# Patient Record
Sex: Female | Born: 1995 | Race: Black or African American | Hispanic: No | Marital: Married | Smoking: Never smoker
Health system: Southern US, Community
[De-identification: ages and names within clinical notes are randomized; demographics above are authoritative.]

## PROBLEM LIST (undated history)

## (undated) ENCOUNTER — Inpatient Hospital Stay (HOSPITAL_COMMUNITY): Payer: Self-pay

## (undated) DIAGNOSIS — F410 Panic disorder [episodic paroxysmal anxiety] without agoraphobia: Secondary | ICD-10-CM

## (undated) DIAGNOSIS — R011 Cardiac murmur, unspecified: Secondary | ICD-10-CM

## (undated) DIAGNOSIS — D649 Anemia, unspecified: Secondary | ICD-10-CM

## (undated) HISTORY — PX: NO PAST SURGERIES: SHX2092

---
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
## 2014-02-28 ENCOUNTER — Emergency Department (HOSPITAL_COMMUNITY)
Admission: EM | Admit: 2014-02-28 | Discharge: 2014-03-01 | Discharge status: Against Medical Advice | Payer: Medicaid Other | Attending: Emergency Medicine | Admitting: Emergency Medicine

## 2014-02-28 ENCOUNTER — Encounter (HOSPITAL_COMMUNITY): Payer: Self-pay | Admitting: Emergency Medicine

## 2014-02-28 DIAGNOSIS — Y998 Other external cause status: Secondary | ICD-10-CM | POA: Diagnosis not present

## 2014-02-28 DIAGNOSIS — Y9289 Other specified places as the place of occurrence of the external cause: Secondary | ICD-10-CM | POA: Insufficient documentation

## 2014-02-28 DIAGNOSIS — N898 Other specified noninflammatory disorders of vagina: Secondary | ICD-10-CM | POA: Diagnosis not present

## 2014-02-28 DIAGNOSIS — W2203XA Walked into furniture, initial encounter: Secondary | ICD-10-CM | POA: Diagnosis not present

## 2014-02-28 DIAGNOSIS — S99921A Unspecified injury of right foot, initial encounter: Secondary | ICD-10-CM | POA: Insufficient documentation

## 2014-02-28 DIAGNOSIS — Y9389 Activity, other specified: Secondary | ICD-10-CM | POA: Insufficient documentation

## 2014-02-28 NOTE — ED Notes (Signed)
Pt states she hit her right pinky toe on the dresser a week ago. Pt states toe has been hurting since incident rates pain 5/10. No visible swelling to right pinky toe. Pt states she normally uses feminine wash but started to use Dial and believes it may be causing change and odor with vaginal discharge. Pt reports change in vaginal discharge has been occurring for a couple of weeks.

## 2014-02-28 NOTE — ED Notes (Signed)
Pt called x2 for triage, no answer.  

## 2014-02-28 NOTE — ED Notes (Signed)
Pt called once for room assignment. No response.

## 2014-03-01 NOTE — ED Provider Notes (Signed)
Patient LWBS after triage.  I did not see or evaluate patient.  Garlon HatchetLisa M Teja Judice, PA-C 03/01/14 [phone]  Shon Batonourtney F Horton, MD 03/01/14 [phone]

## 2014-03-29 ENCOUNTER — Inpatient Hospital Stay (HOSPITAL_COMMUNITY)
Admission: AD | Admit: 2014-03-29 | Discharge: 2014-03-29 | Disposition: A | Discharge status: Home / Self Care | Payer: Medicaid Other | Source: Ambulatory Visit | Attending: Family Medicine | Admitting: Family Medicine

## 2014-03-29 ENCOUNTER — Encounter (HOSPITAL_COMMUNITY): Payer: Self-pay | Admitting: *Deleted

## 2014-03-29 DIAGNOSIS — R5383 Other fatigue: Secondary | ICD-10-CM | POA: Diagnosis not present

## 2014-03-29 DIAGNOSIS — O210 Mild hyperemesis gravidarum: Secondary | ICD-10-CM | POA: Insufficient documentation

## 2014-03-29 DIAGNOSIS — Z3A01 Less than 8 weeks gestation of pregnancy: Secondary | ICD-10-CM | POA: Insufficient documentation

## 2014-03-29 DIAGNOSIS — O219 Vomiting of pregnancy, unspecified: Secondary | ICD-10-CM

## 2014-03-29 HISTORY — DX: Cardiac murmur, unspecified: R01.1

## 2014-03-29 LAB — URINALYSIS, ROUTINE W REFLEX MICROSCOPIC
Bilirubin Urine: NEGATIVE
GLUCOSE, UA: NEGATIVE mg/dL
HGB URINE DIPSTICK: NEGATIVE
Ketones, ur: NEGATIVE mg/dL
Leukocytes, UA: NEGATIVE
NITRITE: NEGATIVE
PROTEIN: NEGATIVE mg/dL
Specific Gravity, Urine: 1.02 (ref 1.005–1.030)
UROBILINOGEN UA: 0.2 mg/dL (ref 0.0–1.0)
pH: 7 (ref 5.0–8.0)

## 2014-03-29 LAB — POCT PREGNANCY, URINE: PREG TEST UR: POSITIVE — AB

## 2014-03-29 MED ORDER — METOCLOPRAMIDE HCL 10 MG PO TABS: 10.0000 mg | ORAL_TABLET | Freq: Three times a day (TID) | ORAL | Status: DC

## 2014-03-29 MED ORDER — METOCLOPRAMIDE HCL 10 MG PO TABS
10.0000 mg | ORAL_TABLET | Freq: Once | ORAL | Status: AC
  Administered 2014-03-29: 10 mg via ORAL
  Filled 2014-03-29: qty 1

## 2014-03-29 MED ORDER — PROMETHAZINE HCL 25 MG PO TABS: 25.0000 mg | ORAL_TABLET | Freq: Four times a day (QID) | ORAL | Status: DC | PRN

## 2014-03-29 NOTE — MAU Provider Note (Signed)
History     CSN: [id number]  Arrival date and time: 03/29/14 1525   First Provider Initiated Contact with Patient 03/29/14 1625      Chief Complaint  Patient presents with  . +HPT   . Fatigue   HPI  [name] is 18 y.o. female G2P0 at 5561w6d who presents with fatigue, N/V. She is also needing a pregnancy verification letter. She has not started prenatal care. She is here with N/V and weakness X 2-3 days. She has vomited twice today. She has not been able to eat or drink much.     OB History    Gravida Para Term Preterm AB TAB SAB Ectopic Multiple Living   2         0      Past Medical History  Diagnosis Date  . Heart murmur     Past Surgical History  Procedure Laterality Date  . No past surgeries      History reviewed. No pertinent family history.  History  Substance Use Topics  . Smoking status: Never Smoker   . Smokeless tobacco: Never Used  . Alcohol Use: No    Allergies: No Known Allergies  Prescriptions prior to admission  Medication Sig Dispense Refill Last Dose  . hydrocortisone cream 1 % Apply 1 application topically daily.   03/29/2014 at Unknown time  . Probiotic Product (PROBIOTIC ACIDOPHILUS) CAPS Take 1 tablet by mouth daily.   Past Week at Unknown time   Results for orders placed or performed during the hospital encounter of 03/29/14 (from the past 48 hour(s))  Urinalysis, Routine w reflex microscopic     Status: None   Collection Time: 03/29/14  3:38 PM  Result Value Ref Range   Color, Urine YELLOW YELLOW   APPearance CLEAR CLEAR   Specific Gravity, Urine 1.020 1.005 - 1.030   pH 7.0 5.0 - 8.0   Glucose, UA NEGATIVE NEGATIVE mg/dL   Hgb urine dipstick NEGATIVE NEGATIVE   Bilirubin Urine NEGATIVE NEGATIVE   Ketones, ur NEGATIVE NEGATIVE mg/dL   Protein, ur NEGATIVE NEGATIVE mg/dL   Urobilinogen, UA 0.2 0.0 - 1.0 mg/dL   Nitrite NEGATIVE NEGATIVE   Leukocytes, UA NEGATIVE NEGATIVE    Comment: MICROSCOPIC NOT DONE ON URINES WITH  NEGATIVE PROTEIN, BLOOD, LEUKOCYTES, NITRITE, OR GLUCOSE <1000 mg/dL.  Pregnancy, urine POC     Status: Abnormal   Collection Time: 03/29/14  3:48 PM  Result Value Ref Range   Preg Test, Ur POSITIVE (A) NEGATIVE    Comment:        THE SENSITIVITY OF THIS METHODOLOGY IS >24 mIU/mL     Review of Systems  Constitutional: Negative for fever and chills.  Gastrointestinal: Positive for nausea and vomiting. Negative for heartburn, abdominal pain (Denies pain currently. Occasional lower abdominal cramping. Rates her pain 0/10. ), diarrhea and constipation.  Genitourinary: Negative for dysuria, urgency, frequency and hematuria.       Denies vaginal bleeding  Denies vaginal discharge    Physical Exam   Blood pressure 120/65, pulse 73, temperature 98.2 F (36.8 C), temperature source Oral, resp. rate 18, height 5' 7.5" (1.715 m), weight 56.87 kg (125 lb 6 oz), last menstrual period 02/16/2014.  Physical Exam  Constitutional: She is oriented to person, place, and time. She appears well-developed and well-nourished. No distress.  HENT:  Head: Normocephalic.  Eyes: Pupils are equal, round, and reactive to light.  Neck: Neck supple.  Cardiovascular: Normal rate and normal heart sounds.   Respiratory:  Effort normal and breath sounds normal.  GI: Soft. She exhibits no distension and no mass. There is no tenderness. There is no rebound and no guarding.  Musculoskeletal: Normal range of motion.  Neurological: She is alert and oriented to person, place, and time.  Skin: Skin is warm. She is not diaphoretic.    MAU Course  Procedures  None  MDM Reglan 10 mg UA shows minimal dehydration   Assessment and Plan   A: 1. Nausea and vomiting during pregnancy    P: Discharge home in stable condition RX: Reglan, phenergan Return to MAU if symptoms worsen First trimester warning signs discussed   Iona HansenJennifer Irene Reyaansh Merlo, NP 03/29/2014 5:15 PM

## 2014-03-29 NOTE — Discharge Instructions (Signed)

## 2014-03-29 NOTE — MAU Note (Signed)
Patient presents with complaints of nausea, fatigue, no appetite and possible fever as well as a +HPT.

## 2014-04-15 ENCOUNTER — Inpatient Hospital Stay (HOSPITAL_COMMUNITY)
Admission: AD | Admit: 2014-04-15 | Discharge: 2014-04-15 | Disposition: A | Discharge status: Home / Self Care | Payer: Medicaid Other | Source: Ambulatory Visit | Attending: Obstetrics & Gynecology | Admitting: Obstetrics & Gynecology

## 2014-04-15 ENCOUNTER — Encounter (HOSPITAL_COMMUNITY): Payer: Self-pay | Admitting: *Deleted

## 2014-04-15 DIAGNOSIS — Z3A08 8 weeks gestation of pregnancy: Secondary | ICD-10-CM | POA: Insufficient documentation

## 2014-04-15 DIAGNOSIS — O210 Mild hyperemesis gravidarum: Secondary | ICD-10-CM | POA: Insufficient documentation

## 2014-04-15 DIAGNOSIS — O219 Vomiting of pregnancy, unspecified: Secondary | ICD-10-CM

## 2014-04-15 DIAGNOSIS — E7439 Other disorders of intestinal carbohydrate absorption: Secondary | ICD-10-CM | POA: Diagnosis not present

## 2014-04-15 DIAGNOSIS — R7302 Impaired glucose tolerance (oral): Secondary | ICD-10-CM

## 2014-04-15 LAB — URINE MICROSCOPIC-ADD ON

## 2014-04-15 LAB — URINALYSIS, ROUTINE W REFLEX MICROSCOPIC
BILIRUBIN URINE: NEGATIVE
GLUCOSE, UA: 250 mg/dL — AB
HGB URINE DIPSTICK: NEGATIVE
Ketones, ur: 15 mg/dL — AB
LEUKOCYTES UA: NEGATIVE
Nitrite: NEGATIVE
PROTEIN: 100 mg/dL — AB
Specific Gravity, Urine: >1.03 — ABNORMAL HIGH (ref 1.005–1.030)
UROBILINOGEN UA: 2 mg/dL — AB (ref 0.0–1.0)
pH: 6 (ref 5.0–8.0)

## 2014-04-15 LAB — GLUCOSE, CAPILLARY: GLUCOSE-CAPILLARY: 133 mg/dL — AB (ref 70–99)

## 2014-04-15 MED ORDER — PROMETHAZINE HCL 25 MG PO TABS
12.5000 mg | ORAL_TABLET | Freq: Once | ORAL | Status: AC
  Administered 2014-04-15: 12.5 mg via ORAL
  Filled 2014-04-15: qty 1

## 2014-04-15 MED ORDER — PROMETHAZINE HCL 12.5 MG PO TABS: 12.5000 mg | ORAL_TABLET | Freq: Four times a day (QID) | ORAL | Status: DC | PRN

## 2014-04-15 NOTE — Discharge Instructions (Signed)
Morning Sickness °Morning sickness is when you feel sick to your stomach (nauseous) during pregnancy. This nauseous feeling may or may not come with vomiting. It often occurs in the morning but can be a problem any time of day. Morning sickness is most common during the first trimester, but it may continue throughout pregnancy. While morning sickness is unpleasant, it is usually harmless unless you develop severe and continual vomiting (hyperemesis gravidarum). This condition requires more intense treatment.  °CAUSES  °The cause of morning sickness is not completely known but seems to be related to normal hormonal changes that occur in pregnancy. °RISK FACTORS °You are at greater risk if you: °· Experienced nausea or vomiting before your pregnancy. °· Had morning sickness during a previous pregnancy. °· Are pregnant with more than one baby, such as twins. °TREATMENT  °Do not use any medicines (prescription, over-the-counter, or herbal) for morning sickness without first talking to your health care provider. Your health care provider may prescribe or recommend: °· Vitamin B6 supplements. °· Anti-nausea medicines. °· The herbal medicine ginger. °HOME CARE INSTRUCTIONS  °· Only take over-the-counter or prescription medicines as directed by your health care provider. °· Taking multivitamins before getting pregnant can prevent or decrease the severity of morning sickness in most women. °· Eat a piece of dry toast or unsalted crackers before getting out of bed in the morning. °· Eat five or six small meals a day. °· Eat dry and bland foods (rice, baked potato). Foods high in carbohydrates are often helpful. °· Do not drink liquids with your meals. Drink liquids between meals. °· Avoid greasy, fatty, and spicy foods. °· Get someone to cook for you if the smell of any food causes nausea and vomiting. °· If you feel nauseous after taking prenatal vitamins, take the vitamins at night or with a snack.  °· Snack on protein  foods (nuts, yogurt, cheese) between meals if you are hungry. °· Eat unsweetened gelatins for desserts. °· Wearing an acupressure wristband (worn for sea sickness) may be helpful. °· Acupuncture may be helpful. °· Do not smoke. °· Get a humidifier to keep the air in your house free of odors. °· Get plenty of fresh air. °SEEK MEDICAL CARE IF:  °· Your home remedies are not working, and you need medicine. °· You feel dizzy or lightheaded. °· You are losing weight. °SEEK IMMEDIATE MEDICAL CARE IF:  °· You have persistent and uncontrolled nausea and vomiting. °· You pass out (faint). °MAKE SURE YOU: °· Understand these instructions. °· Will watch your condition. °· Will get help right away if you are not doing well or get worse. °Document Released: 05/26/2006 Document Revised: 04/09/2013 Document Reviewed: 09/19/2012 °ExitCare® Patient Information ©2015 [employer]. This information is not intended to replace advice given to you by your health care provider. Make sure you discuss any questions you have with your health care provider. °Prenatal Care Providers °[facility]    [facility] ° Phone- [phone]     Phone: [phone] °         °[facility]                      [facility] ° @[place]     Phone: [phone] ° Phone: [phone] °        [facility] °[facility]     Phone: [phone] ° Phone: [phone]   °          [facility] °[facility] [place]                hone: [phone] ° Phone: [phone] °        [facility] °Dr. Bernard Marshall      Phone: [phone] ° Phone: [phone] °        [facility] °[facility]                Phone: [phone] ° [facility]  ° Phone:[phone]    [facility] °         Phone: [phone] °[facility] °  Phone: [phone] ° °

## 2014-04-15 NOTE — MAU Note (Signed)
Pt states she  Has been throwing up constantly about 5 days ago. Pt states she can't keep anything down not even water

## 2014-04-15 NOTE — MAU Provider Note (Signed)
Chief Complaint: Hematemesis and Abdominal Cramping   First Provider Initiated Contact with Patient 04/15/14 19 1644     SUBJECTIVE HPI: Angela Acosta States is a 18 y.o. G1P0 at 2258w2d by LMP who presents with persistent nausea and vomiting. Had blood streaked emesis today. Has not retained much food or fluids for 4 days, vomiting most times after she eats. Has not eaten today. Ran out of Phenergan 5 d ago. It had been effective. Mid abdomen is sore from vomiting. Denies pelvic pain. No vaginal bleeding. No prenatal care yet. Has [facility].   Past Medical History  Diagnosis Date  . Heart murmur    OB History  Gravida Para Term Preterm AB SAB TAB Ectopic Multiple Living  1         0    # Outcome Date GA Lbr Len/2nd Weight Sex Delivery Anes PTL Lv  1 Current              Past Surgical History  Procedure Laterality Date  . No past surgeries     History   Social History  . Marital Status: Single    Spouse Name: N/A    Number of Children: N/A  . Years of Education: N/A   Occupational History  . Not on file.   Social History Main Topics  . Smoking status: Never Smoker   . Smokeless tobacco: Never Used  . Alcohol Use: No  . Drug Use: No  . Sexual Activity: Yes    Birth Control/ Protection: Condom   Other Topics Concern  . Not on file   Social History Narrative   No current facility-administered medications on file prior to encounter.   Current Outpatient Prescriptions on File Prior to Encounter  Medication Sig Dispense Refill  . metoCLOPramide (REGLAN) 10 MG tablet Take 1 tablet (10 mg total) by mouth 3 (three) times daily before meals. 30 tablet 0  . promethazine (PHENERGAN) 25 MG tablet Take 1 tablet (25 mg total) by mouth every 6 (six) hours as needed for nausea or vomiting. 30 tablet 0   Not on File  ROS: Pertinent items in HPI  OBJECTIVE Blood pressure 127/80, pulse 92, temperature 98.8 F (37.1 C), resp. rate 16, height 5' 7.5" (1.715 m), weight 51.529 kg (113 lb 9.6 oz),  last menstrual period 02/16/2014, SpO2 100 %. GENERAL: Thin  female in no acute distress. Tearful worried about fetus due to her not eating. HEENT: Normocephalic HEART: normal rate RESP: normal effort ABDOMEN: Soft, non-tender EXTREMITIES: Nontender, no edema NEURO: Alert and oriented  LAB RESULTS Results for orders placed or performed during the hospital encounter of 04/15/14 (from the past 24 hour(s))  Urinalysis, Routine w reflex microscopic     Status: Abnormal   Collection Time: 04/15/14  4:24 PM  Result Value Ref Range   Color, Urine AMBER (A) YELLOW   APPearance CLEAR CLEAR   Specific Gravity, Urine >1.030 (H) 1.005 - 1.030   pH 6.0 5.0 - 8.0   Glucose, UA 250 (A) NEGATIVE mg/dL   Hgb urine dipstick NEGATIVE NEGATIVE   Bilirubin Urine NEGATIVE NEGATIVE   Ketones, ur 15 (A) NEGATIVE mg/dL   Protein, ur 161100 (A) NEGATIVE mg/dL   Urobilinogen, UA 2.0 (H) 0.0 - 1.0 mg/dL   Nitrite NEGATIVE NEGATIVE   Leukocytes, UA NEGATIVE NEGATIVE  Urine microscopic-add on     Status: Abnormal   Collection Time: 04/15/14  4:24 PM  Result Value Ref Range   Squamous Epithelial / LPF FEW (A) RARE  WBC, UA 3-6 <3 WBC/hpf   Bacteria, UA MANY (A) RARE   Urine-Other MUCOUS PRESENT   Glucose, capillary     Status: Abnormal   Collection Time: 04/15/14  5:24 PM  Result Value Ref Range   Glucose-Capillary 133 (H) 70 - 99 mg/dL    IMAGING Bedside US by me: Normal fetal heart activity viewed by pt  MAU COURSE No vomiting while here. Phenergan 12.5 mg po given  ASSESSMENT 1. Nausea and vomiting during pregnancy prior to [redacted] weeks gestation   2. Glucose intolerance (impaired glucose tolerance)     PLAN Discharge home with reassurance AVS on morning sickness.  Needs further evaluation for possible impaired glucose tolerance    Medication List    TAKE these medications        metoCLOPramide 10 MG tablet  Commonly known as:  REGLAN  Take 1 tablet (10 mg total) by mouth 3 (three) times  daily before meals.     promethazine 12.5 MG tablet  Commonly known as:  PHENERGAN  Take 1 tablet (12.5 mg total) by mouth every 6 (six) hours as needed for nausea or vomiting.       Follow-up Information    [facility] [place] In 1 week.   Why:   Choose pregnancy care provider from the list given   Contact information:   [address] [facility] [address] [place] [id number] [phone]      Danae OrleansDeirdre C Kinlie Janice, CNM 04/15/2014  4:45 PM

## 2014-04-15 NOTE — Progress Notes (Signed)
Pt states she thinks her abdomen is sore from throwing up so much

## 2014-04-15 NOTE — MAU Note (Signed)
Patient states she has had vomiting through the entire pregnancy, was given Phenergan and Reglan but ran out of Phenergan about 2 weeks ago. Has not been able to keep anything down for at least 4 days. Having abdominal cramping. Denies bleeding or discharge, no diarrhea.

## 2014-04-18 NOTE — L&D Delivery Note (Signed)
Patient was C/C/+2 and pushed for 45 minutes with epidural.    Pt was uncomfortable with pushing and getting anxious- offered episiotomy to effect delivery and she accepted. VE placed outlet to help with delivery as well- pulled only once.  VEVD  female infant, Apgars 9,9, weight P.   The patient had a third degree extention of second degree midline episiotomy repaired with 2-0 vicryl R in usual fashion. Fundus was firm. EBL was expected amount- 200 cc by my estimate.. Placenta was delivered intact. Vagina was clear.  Baby was vigorous and doing skin to skin with mother.  Isidro Monks A

## 2014-05-05 ENCOUNTER — Encounter (HOSPITAL_COMMUNITY): Payer: Self-pay | Admitting: *Deleted

## 2014-05-05 ENCOUNTER — Inpatient Hospital Stay (HOSPITAL_COMMUNITY)
Admission: AD | Admit: 2014-05-05 | Discharge: 2014-05-05 | Disposition: A | Discharge status: Home / Self Care | Payer: Medicaid Other | Source: Ambulatory Visit | Attending: Family Medicine | Admitting: Family Medicine

## 2014-05-05 DIAGNOSIS — Z3A11 11 weeks gestation of pregnancy: Secondary | ICD-10-CM | POA: Diagnosis not present

## 2014-05-05 DIAGNOSIS — O23591 Infection of other part of genital tract in pregnancy, first trimester: Secondary | ICD-10-CM | POA: Insufficient documentation

## 2014-05-05 DIAGNOSIS — N760 Acute vaginitis: Secondary | ICD-10-CM | POA: Insufficient documentation

## 2014-05-05 DIAGNOSIS — R1030 Lower abdominal pain, unspecified: Secondary | ICD-10-CM

## 2014-05-05 DIAGNOSIS — Z3491 Encounter for supervision of normal pregnancy, unspecified, first trimester: Secondary | ICD-10-CM

## 2014-05-05 DIAGNOSIS — B9689 Other specified bacterial agents as the cause of diseases classified elsewhere: Secondary | ICD-10-CM | POA: Insufficient documentation

## 2014-05-05 DIAGNOSIS — O9989 Other specified diseases and conditions complicating pregnancy, childbirth and the puerperium: Secondary | ICD-10-CM | POA: Insufficient documentation

## 2014-05-05 DIAGNOSIS — D649 Anemia, unspecified: Secondary | ICD-10-CM

## 2014-05-05 LAB — COMPREHENSIVE METABOLIC PANEL
ALBUMIN: 3.5 g/dL (ref 3.5–5.2)
ALK PHOS: 45 U/L (ref 39–117)
ALT: 15 U/L (ref 0–35)
ANION GAP: 5 (ref 5–15)
AST: 19 U/L (ref 0–37)
BUN: 7 mg/dL (ref 6–23)
CALCIUM: 9.1 mg/dL (ref 8.4–10.5)
CO2: 25 mmol/L (ref 19–32)
CREATININE: 0.33 mg/dL — AB (ref 0.50–1.10)
Chloride: 105 mEq/L (ref 96–112)
GFR calc non Af Amer: >90 mL/min (ref >90)
GLUCOSE: 105 mg/dL — AB (ref 70–99)
Potassium: 4.1 mmol/L (ref 3.5–5.1)
Sodium: 135 mmol/L (ref 135–145)
TOTAL PROTEIN: 6.9 g/dL (ref 6.0–8.3)
Total Bilirubin: 0.3 mg/dL (ref 0.3–1.2)

## 2014-05-05 LAB — URINALYSIS, ROUTINE W REFLEX MICROSCOPIC
Bilirubin Urine: NEGATIVE
Glucose, UA: NEGATIVE mg/dL
Hgb urine dipstick: NEGATIVE
KETONES UR: 15 mg/dL — AB
LEUKOCYTES UA: NEGATIVE
NITRITE: NEGATIVE
PH: 6.5 (ref 5.0–8.0)
PROTEIN: NEGATIVE mg/dL
Urobilinogen, UA: 0.2 mg/dL (ref 0.0–1.0)

## 2014-05-05 LAB — CBC WITH DIFFERENTIAL/PLATELET
BASOS ABS: 0 10*3/uL (ref 0.0–0.1)
BASOS PCT: 0 % (ref 0–1)
Eosinophils Absolute: 0 10*3/uL (ref 0.0–0.7)
Eosinophils Relative: 1 % (ref 0–5)
HCT: 31.6 % — ABNORMAL LOW (ref 36.0–46.0)
HEMOGLOBIN: 10.8 g/dL — AB (ref 12.0–15.0)
LYMPHS ABS: 2.2 10*3/uL (ref 0.7–4.0)
LYMPHS PCT: 26 % (ref 12–46)
MCH: 24.5 pg — ABNORMAL LOW (ref 26.0–34.0)
MCHC: 34.2 g/dL (ref 30.0–36.0)
MCV: 71.8 fL — AB (ref 78.0–100.0)
MONOS PCT: 7 % (ref 3–12)
Monocytes Absolute: 0.6 10*3/uL (ref 0.1–1.0)
NEUTROS ABS: 5.8 10*3/uL (ref 1.7–7.7)
Neutrophils Relative %: 67 % (ref 43–77)
Platelets: 256 10*3/uL (ref 150–400)
RBC: 4.4 MIL/uL (ref 3.87–5.11)
RDW: 14.6 % (ref 11.5–15.5)
WBC: 8.6 10*3/uL (ref 4.0–10.5)

## 2014-05-05 LAB — WET PREP, GENITAL
Trich, Wet Prep: NONE SEEN
Yeast Wet Prep HPF POC: NONE SEEN

## 2014-05-05 MED ORDER — METRONIDAZOLE 500 MG PO TABS: 500.0000 mg | ORAL_TABLET | Freq: Two times a day (BID) | ORAL | Status: DC

## 2014-05-05 NOTE — MAU Provider Note (Signed)
History     CSN: [id number]  Arrival date and time: 05/05/14 1240   None     Chief Complaint  Patient presents with  . Abdominal Pain   Abdominal Pain Associated symptoms include nausea. Pertinent negatives include no constipation, diarrhea, dysuria, fever, headaches, hematuria or vomiting.    JM is a 19 y.o. G1P0 8370w1d presenting with abdominal cramps in suprapubic region. Started experiencing abdominal cramps about 3 days ago. The cramps are a 5/10 on the pain scale and are intermittent. Nothing reduces the discomfort. Cramps occur more often when stomach is empty.  Denies contractions, vaginal pain, vaginal bleeding, vaginal discharge, vomiting, blood in stool, diarrhea and diarrhea. Experiencing some nausea. Last BM was 1-2 days ago, however has difficulty passing stool.   Of note, pt states "bladder is sore" after voiding x 1 week. Has difficulty starting stream and hurts when bladder is full. Feels better sitting on the toilet after done voiding. Denies dysuria, hematuria, painful urination.   Pt is currently sexually active, last sexual intercourse was 2 weeks ago x 1 partner - no protection. Denies h/o STDs. Partner had chlamydia a couple years ago.   [facility], [facility]   OB History    Gravida Para Term Preterm AB TAB SAB Ectopic Multiple Living   1         0      Past Medical History  Diagnosis Date  . Heart murmur     Past Surgical History  Procedure Laterality Date  . No past surgeries      Family History  Problem Relation Age of Onset  . Hypertension Father   . Cancer Paternal Grandmother     History  Substance Use Topics  . Smoking status: Never Smoker   . Smokeless tobacco: Never Used  . Alcohol Use: No    Allergies: No Known Allergies  Prescriptions prior to admission  Medication Sig Dispense Refill Last Dose  . acetaminophen (TYLENOL) 500 MG tablet Take 1,000 mg by mouth every 6 (six) hours as needed for  moderate pain.   Past Month at Unknown time  . hydrocortisone cream 1 % Apply 1 application topically as needed for itching (dry skin).     Marland Kitchen. metoCLOPramide (REGLAN) 10 MG tablet Take 1 tablet (10 mg total) by mouth 3 (three) times daily before meals. 30 tablet 0 Past Month at Unknown time  . prenatal vitamin w/FE, FA (NATACHEW) 29-1 MG CHEW chewable tablet Chew 1 tablet by mouth daily at 12 noon.   Past Week at Unknown time  . promethazine (PHENERGAN) 12.5 MG tablet Take 1 tablet (12.5 mg total) by mouth every 6 (six) hours as needed for nausea or vomiting. 30 tablet 0 Past Week at Unknown time    Review of Systems  Constitutional: Positive for malaise/fatigue. Negative for fever, chills and diaphoresis.  Eyes: Negative for blurred vision.  Respiratory: Positive for cough. Negative for hemoptysis and sputum production.   Cardiovascular: Positive for palpitations (x1 week ago, had palpitations since little girl). Negative for chest pain.  Gastrointestinal: Positive for heartburn, nausea and abdominal pain. Negative for vomiting, diarrhea, constipation and blood in stool.  Genitourinary: Negative for dysuria and hematuria.  Neurological: Positive for dizziness (When wake up or get up too fast.). Negative for headaches.  Psychiatric/Behavioral: Negative for depression. The patient is nervous/anxious.    Physical Exam   Blood pressure 110/64, pulse 84, temperature 98.9 F (37.2 C), temperature source Oral, resp. rate 16,  height 5' 7.5" (1.715 m), weight 53.638 kg (118 lb 4 oz), last menstrual period 02/16/2014.   Has gained ~5lbs since last MAU visit on 12/29.   Physical Exam  Constitutional: She is oriented to person, place, and time. She appears well-developed and well-nourished. No distress.  HENT:  Head: Normocephalic and atraumatic.  Neck: Normal range of motion.  Cardiovascular: Normal heart sounds and intact distal pulses.   Respiratory: Effort normal and breath sounds normal.  GI:  Soft. Bowel sounds are normal. She exhibits no distension. There is tenderness in the suprapubic area. There is no rebound and no guarding.  Genitourinary: Uterus is tender. Cervix exhibits discharge (White, frothy). Cervix exhibits no friability. Right adnexum displays no tenderness. Left adnexum displays no tenderness. No erythema, tenderness or bleeding in the vagina. Vaginal discharge (White, frothy) found.  Musculoskeletal: Normal range of motion.  Neurological: She is alert and oriented to person, place, and time.  Skin: Skin is warm and dry.  Psychiatric: She has a normal mood and affect. Her behavior is normal. Judgment normal.   Fetal HR tones heard via doppler 05/05/2014 @ 1334, HR 163bpm.  On exam uterus is 11-12 week size.  Results for orders placed or performed during the hospital encounter of 05/05/14 (from the past 24 hour(s))  Urinalysis, Routine w reflex microscopic     Status: Abnormal   Collection Time: 05/05/14  1:08 PM  Result Value Ref Range   Color, Urine YELLOW YELLOW   APPearance CLEAR CLEAR   Specific Gravity, Urine >1.030 (H) 1.005 - 1.030   pH 6.5 5.0 - 8.0   Glucose, UA NEGATIVE NEGATIVE mg/dL   Hgb urine dipstick NEGATIVE NEGATIVE   Bilirubin Urine NEGATIVE NEGATIVE   Ketones, ur 15 (A) NEGATIVE mg/dL   Protein, ur NEGATIVE NEGATIVE mg/dL   Urobilinogen, UA 0.2 0.0 - 1.0 mg/dL   Nitrite NEGATIVE NEGATIVE   Leukocytes, UA NEGATIVE NEGATIVE  Comprehensive metabolic panel     Status: Abnormal   Collection Time: 05/05/14  2:50 PM  Result Value Ref Range   Sodium 135 135 - 145 mmol/L   Potassium 4.1 3.5 - 5.1 mmol/L   Chloride 105 96 - 112 mEq/L   CO2 25 19 - 32 mmol/L   Glucose, Bld 105 (H) 70 - 99 mg/dL   BUN 7 6 - 23 mg/dL   Creatinine, Ser 1.61 (L) 0.50 - 1.10 mg/dL   Calcium 9.1 8.4 - 09.6 mg/dL   Total Protein 6.9 6.0 - 8.3 g/dL   Albumin 3.5 3.5 - 5.2 g/dL   AST 19 0 - 37 U/L   ALT 15 0 - 35 U/L   Alkaline Phosphatase 45 39 - 117 U/L   Total  Bilirubin 0.3 0.3 - 1.2 mg/dL   GFR calc non Af Amer >90 >90 mL/min   GFR calc Af Amer >90 >90 mL/min   Anion gap 5 5 - 15  CBC with Differential     Status: Abnormal   Collection Time: 05/05/14  2:50 PM  Result Value Ref Range   WBC 8.6 4.0 - 10.5 K/uL   RBC 4.40 3.87 - 5.11 MIL/uL   Hemoglobin 10.8 (L) 12.0 - 15.0 g/dL   HCT 04.5 (L) 40.9 - 81.1 %   MCV 71.8 (L) 78.0 - 100.0 fL   MCH 24.5 (L) 26.0 - 34.0 pg   MCHC 34.2 30.0 - 36.0 g/dL   RDW 91.4 78.2 - 95.6 %   Platelets 256 150 - 400 K/uL  Neutrophils Relative % 67 43 - 77 %   Neutro Abs 5.8 1.7 - 7.7 K/uL   Lymphocytes Relative 26 12 - 46 %   Lymphs Abs 2.2 0.7 - 4.0 K/uL   Monocytes Relative 7 3 - 12 %   Monocytes Absolute 0.6 0.1 - 1.0 K/uL   Eosinophils Relative 1 0 - 5 %   Eosinophils Absolute 0.0 0.0 - 0.7 K/uL   Basophils Relative 0 0 - 1 %   Basophils Absolute 0.0 0.0 - 0.1 K/uL  Wet prep, genital     Status: Abnormal   Collection Time: 05/05/14  2:58 PM  Result Value Ref Range   Yeast Wet Prep HPF POC NONE SEEN NONE SEEN   Trich, Wet Prep NONE SEEN NONE SEEN   Clue Cells Wet Prep HPF POC MANY (A) NONE SEEN   WBC, Wet Prep HPF POC FEW (A) NONE SEEN    MAU Course  Procedures  MDM Ordered UA, CBC, CMP, GC/Chlamydia-pending,  and wet prep.   Assessment and Plan  Assessment 1. First trimester pregnancy 2. Low hemoglobin 3. Lower abdominal pain 4. Bacterial vaginosis  Plan 1. Discharge home 2. Schedule prenatal care visit with OBGYN 3. Be sure to take prenatal vitamin, consume iron rich foods.  4. Prescribe Metronidazole  po BID x 7 days. Pelvic rest X 1 week  If symptoms worsen f/u at MAU.   Arvilla Meres L PA-S 05/05/2014, 4:17 PM  I have reviewed the Student's HPI and plan of care and agree with findings.  I examined the patient

## 2014-05-05 NOTE — Discharge Instructions (Signed)
Abdominal Pain During Pregnancy Belly (abdominal) pain is common during pregnancy. Most of the time, it is not a serious problem. Other times, it can be a sign that something is wrong with the pregnancy. Always tell your doctor if you have belly pain. HOME CARE Monitor your belly pain for any changes. The following actions may help you feel better:  Do not have sex (intercourse) or put anything in your vagina until you feel better.  Rest until your pain stops.  Drink clear fluids if you feel sick to your stomach (nauseous). Do not eat solid food until you feel better.  Only take medicine as told by your doctor.  Keep all doctor visits as told. GET HELP RIGHT AWAY IF:   You are bleeding, leaking fluid, or pieces of tissue come out of your vagina.  You have more pain or cramping.  You keep throwing up (vomiting).  You have pain when you pee (urinate) or have blood in your pee.  You have a fever.  You do not feel your baby moving as much.  You feel very weak or feel like passing out.  You have trouble breathing, with or without belly pain.  You have a very bad headache and belly pain.  You have fluid leaking from your vagina and belly pain.  You keep having watery poop (diarrhea).  Your belly pain does not go away after resting, or the pain gets worse. MAKE SURE YOU:   Understand these instructions.  Will watch your condition.  Will get help right away if you are not doing well or get worse. Document Released: 03/23/2009 Document Revised: 12/05/2012 Document Reviewed: 11/01/2012 [facility] Patient Information 2015 [place], [place]. This information is not intended to replace advice given to you by your health care provider. Make sure you discuss any questions you have with your health care provider. Anemia, Nonspecific Anemia is a condition in which the concentration of red blood cells or hemoglobin in the blood is below normal. Hemoglobin is a substance in red blood cells  that carries oxygen to the tissues of the body. Anemia results in not enough oxygen reaching these tissues.  CAUSES  Common causes of anemia include:   Excessive bleeding. Bleeding may be internal or external. This includes excessive bleeding from periods (in women) or from the intestine.   Poor nutrition.   Chronic kidney, thyroid, and liver disease.  Bone marrow disorders that decrease red blood cell production.  Cancer and treatments for cancer.  HIV, AIDS, and their treatments.  Spleen problems that increase red blood cell destruction.  Blood disorders.  Excess destruction of red blood cells due to infection, medicines, and autoimmune disorders. SIGNS AND SYMPTOMS   Minor weakness.   Dizziness.   Headache.  Palpitations.   Shortness of breath, especially with exercise.   Paleness.  Cold sensitivity.  Indigestion.  Nausea.  Difficulty sleeping.  Difficulty concentrating. Symptoms may occur suddenly or they may develop slowly.  DIAGNOSIS  Additional blood tests are often needed. These help your health care provider determine the best treatment. Your health care provider will check your stool for blood and look for other causes of blood loss.  TREATMENT  Treatment varies depending on the cause of the anemia. Treatment can include:   Supplements of iron, vitamin B12, or folic acid.   Hormone medicines.   A blood transfusion. This may be needed if blood loss is severe.   Hospitalization. This may be needed if there is significant continual blood loss.  Dietary changes.  Spleen removal. HOME CARE INSTRUCTIONS Keep all follow-up appointments. It often takes many weeks to correct anemia, and having your health care provider check on your condition and your response to treatment is very important. SEEK IMMEDIATE MEDICAL CARE IF:   You develop extreme weakness, shortness of breath, or chest pain.   You become dizzy or have trouble  concentrating.  You develop heavy vaginal bleeding.   You develop a rash.   You have bloody or black, tarry stools.   You faint.   You vomit up blood.   You vomit repeatedly.   You have abdominal pain.  You have a fever or persistent symptoms for more than 2-3 days.   You have a fever and your symptoms suddenly get worse.   You are dehydrated.  MAKE SURE YOU:  Understand these instructions.  Will watch your condition.  Will get help right away if you are not doing well or get worse. Document Released: 05/12/2004 Document Revised: 12/05/2012 Document Reviewed: 09/28/2012 [facility] Patient Information 2015 [place], [place]. This information is not intended to replace advice given to you by your health care provider. Make sure you discuss any questions you have with your health care provider. Bacterial Vaginosis Bacterial vaginosis is an infection of the vagina. It happens when too many of certain germs (bacteria) grow in the vagina. HOME CARE  Take your medicine as told by your doctor.  Finish your medicine even if you start to feel better.  Do not have sex until you finish your medicine and are better.  Tell your sex partner that you have an infection. They should see their doctor for treatment.  Practice safe sex. Use condoms. Have only one sex partner. GET HELP IF:  You are not getting better after 3 days of treatment.  You have more grey fluid (discharge) coming from your vagina than before.  You have more pain than before.  You have a fever. MAKE SURE YOU:   Understand these instructions.  Will watch your condition.  Will get help right away if you are not doing well or get worse. Document Released: 01/12/2008 Document Revised: 01/23/2013 Document Reviewed: 11/14/2012 [facility]Exit[facility] Patient Information 2015 [place]ExitCare, [place]LL[place]. This information is not intended to replace advice given to you by your health care provider. Make sure you discuss any questions  you have with your health care provider.

## 2014-05-05 NOTE — MAU Note (Signed)
Had lost weight initially but has gained 5 lbs back in past 2 weeks; stated that her "sugar" was up at her last visit and thought that since she was dizzy for past 3 days that her "sugar must be UP";requesting an ultrasound "to check on the baby"; C/o abdominal cramps for past 3 days but denies any discharge or bleeding;

## 2014-05-05 NOTE — MAU Note (Signed)
C/o cramps for past 3 days; denies bleeding; c/o dizziness for past 3 days;

## 2014-05-06 LAB — GC/CHLAMYDIA PROBE AMP (~~LOC~~) NOT AT ARMC
CHLAMYDIA, DNA PROBE: NEGATIVE
Neisseria Gonorrhea: NEGATIVE

## 2014-06-16 LAB — OB RESULTS CONSOLE HEPATITIS B SURFACE ANTIGEN: Hepatitis B Surface Ag: NEGATIVE

## 2014-06-16 LAB — OB RESULTS CONSOLE RUBELLA ANTIBODY, IGM: Rubella: IMMUNE

## 2014-06-16 LAB — OB RESULTS CONSOLE HIV ANTIBODY (ROUTINE TESTING): HIV: NONREACTIVE

## 2014-06-16 LAB — OB RESULTS CONSOLE GC/CHLAMYDIA
CHLAMYDIA, DNA PROBE: NEGATIVE
Gonorrhea: NEGATIVE

## 2014-06-16 LAB — OB RESULTS CONSOLE RPR: RPR: NONREACTIVE

## 2014-10-29 ENCOUNTER — Other Ambulatory Visit: Payer: Self-pay | Admitting: Obstetrics and Gynecology

## 2014-10-29 LAB — OB RESULTS CONSOLE GBS: GBS: NEGATIVE

## 2014-11-17 ENCOUNTER — Telehealth (HOSPITAL_COMMUNITY): Payer: Self-pay | Admitting: *Deleted

## 2014-11-18 ENCOUNTER — Encounter (HOSPITAL_COMMUNITY): Payer: Self-pay | Admitting: *Deleted

## 2014-11-18 ENCOUNTER — Inpatient Hospital Stay (HOSPITAL_COMMUNITY)
Admission: AD | Admit: 2014-11-18 | Discharge: 2014-11-18 | Disposition: A | Discharge status: Home / Self Care | Payer: Medicaid Other | Source: Ambulatory Visit | Attending: Family Medicine | Admitting: Family Medicine

## 2014-11-18 DIAGNOSIS — Z3493 Encounter for supervision of normal pregnancy, unspecified, third trimester: Secondary | ICD-10-CM | POA: Insufficient documentation

## 2014-11-18 NOTE — Discharge Instructions (Signed)
Third Trimester of Pregnancy °The third trimester is from week 29 through week 42, months 7 through 9. The third trimester is a time when the fetus is growing rapidly. At the end of the ninth month, the fetus is about 20 inches in length and weighs 6-10 pounds.  °BODY CHANGES °Your body goes through many changes during pregnancy. The changes vary from woman to woman.  °· Your weight will continue to increase. You can expect to gain 25-35 pounds (11-16 kg) by the end of the pregnancy. °· You may begin to get stretch marks on your hips, abdomen, and breasts. °· You may urinate more often because the fetus is moving lower into your pelvis and pressing on your bladder. °· You may develop or continue to have heartburn as a result of your pregnancy. °· You may develop constipation because certain hormones are causing the muscles that push waste through your intestines to slow down. °· You may develop hemorrhoids or swollen, bulging veins (varicose veins). °· You may have pelvic pain because of the weight gain and pregnancy hormones relaxing your joints between the bones in your pelvis. Backaches may result from overexertion of the muscles supporting your posture. °· You may have changes in your hair. These can include thickening of your hair, rapid growth, and changes in texture. Some women also have hair loss during or after pregnancy, or hair that feels dry or thin. Your hair will most likely return to normal after your baby is born. °· Your breasts will continue to grow and be tender. A yellow discharge may leak from your breasts called colostrum. °· Your belly button may stick out. °· You may feel short of breath because of your expanding uterus. °· You may notice the fetus "dropping," or moving lower in your abdomen. °· You may have a bloody mucus discharge. This usually occurs a few days to a week before labor begins. °· Your cervix becomes thin and soft (effaced) near your due date. °WHAT TO EXPECT AT YOUR PRENATAL  EXAMS  °You will have prenatal exams every 2 weeks until week 36. Then, you will have weekly prenatal exams. During a routine prenatal visit: °· You will be weighed to make sure you and the fetus are growing normally. °· Your blood pressure is taken. °· Your abdomen will be measured to track your baby's growth. °· The fetal heartbeat will be listened to. °· Any test results from the previous visit will be discussed. °· You may have a cervical check near your due date to see if you have effaced. °At around 36 weeks, your caregiver will check your cervix. At the same time, your caregiver will also perform a test on the secretions of the vaginal tissue. This test is to determine if a type of bacteria, Group B streptococcus, is present. Your caregiver will explain this further. °Your caregiver may ask you: °· What your birth plan is. °· How you are feeling. °· If you are feeling the baby move. °· If you have had any abnormal symptoms, such as leaking fluid, bleeding, severe headaches, or abdominal cramping. °· If you have any questions. °Other tests or screenings that may be performed during your third trimester include: °· Blood tests that check for low iron levels (anemia). °· Fetal testing to check the health, activity level, and growth of the fetus. Testing is done if you have certain medical conditions or if there are problems during the pregnancy. °FALSE LABOR °You may feel small, irregular contractions that   eventually go away. These are called Braxton Hicks contractions, or false labor. Contractions may last for hours, days, or even weeks before true labor sets in. If contractions come at regular intervals, intensify, or become painful, it is best to be seen by your caregiver.  °SIGNS OF LABOR  °· Menstrual-like cramps. °· Contractions that are 5 minutes apart or less. °· Contractions that start on the top of the uterus and spread down to the lower abdomen and back. °· A sense of increased pelvic pressure or back  pain. °· A watery or bloody mucus discharge that comes from the vagina. °If you have any of these signs before the 37th week of pregnancy, call your caregiver right away. You need to go to the hospital to get checked immediately. °HOME CARE INSTRUCTIONS  °· Avoid all smoking, herbs, alcohol, and unprescribed drugs. These chemicals affect the formation and growth of the baby. °· Follow your caregiver's instructions regarding medicine use. There are medicines that are either safe or unsafe to take during pregnancy. °· Exercise only as directed by your caregiver. Experiencing uterine cramps is a good sign to stop exercising. °· Continue to eat regular, healthy meals. °· Wear a good support bra for breast tenderness. °· Do not use hot tubs, steam rooms, or saunas. °· Wear your seat belt at all times when driving. °· Avoid raw meat, uncooked cheese, cat litter boxes, and soil used by cats. These carry germs that can cause birth defects in the baby. °· Take your prenatal vitamins. °· Try taking a stool softener (if your caregiver approves) if you develop constipation. Eat more high-fiber foods, such as fresh vegetables or fruit and whole grains. Drink plenty of fluids to keep your urine clear or pale yellow. °· Take warm sitz baths to soothe any pain or discomfort caused by hemorrhoids. Use hemorrhoid cream if your caregiver approves. °· If you develop varicose veins, wear support hose. Elevate your feet for 15 minutes, 3-4 times a day. Limit salt in your diet. °· Avoid heavy lifting, wear low heal shoes, and practice good posture. °· Rest a lot with your legs elevated if you have leg cramps or low back pain. °· Visit your dentist if you have not gone during your pregnancy. Use a soft toothbrush to brush your teeth and be gentle when you floss. °· A sexual relationship may be continued unless your caregiver directs you otherwise. °· Do not travel far distances unless it is absolutely necessary and only with the approval  of your caregiver. °· Take prenatal classes to understand, practice, and ask questions about the labor and delivery. °· Make a trial run to the hospital. °· Pack your hospital bag. °· Prepare the baby's nursery. °· Continue to go to all your prenatal visits as directed by your caregiver. °SEEK MEDICAL CARE IF: °· You are unsure if you are in labor or if your water has broken. °· You have dizziness. °· You have mild pelvic cramps, pelvic pressure, or nagging pain in your abdominal area. °· You have persistent nausea, vomiting, or diarrhea. °· You have a bad smelling vaginal discharge. °· You have pain with urination. °SEEK IMMEDIATE MEDICAL CARE IF:  °· You have a fever. °· You are leaking fluid from your vagina. °· You have spotting or bleeding from your vagina. °· You have severe abdominal cramping or pain. °· You have rapid weight loss or gain. °· You have shortness of breath with chest pain. °· You notice sudden or extreme swelling   of your face, hands, ankles, feet, or legs. °· You have not felt your baby move in over an hour. °· You have severe headaches that do not go away with medicine. °· You have vision changes. °Document Released: 03/29/2001 Document Revised: 04/09/2013 Document Reviewed: 06/05/2012 °Exit[employer]® Patient Information ©2015 [employer]. This information is not intended to replace advice given to you by your health care provider. Make sure you discuss any questions you have with your health care provider. °Fetal Movement Counts °Patient Name: __________________________________________________ Patient Due Date: ____________________ °Performing a fetal movement count is highly recommended in high-risk pregnancies, but it is good for every pregnant woman to do. Your health care provider may ask you to start counting fetal movements at 28 weeks of the pregnancy. Fetal movements often increase: °· After eating a full meal. °· After physical activity. °· After eating or drinking something sweet or  cold. °· At rest. °Pay attention to when you feel the baby is most active. This will help you notice a pattern of your baby's sleep and wake cycles and what factors contribute to an increase in fetal movement. It is important to perform a fetal movement count at the same time each day when your baby is normally most active.  °HOW TO COUNT FETAL MOVEMENTS °· Find a quiet and comfortable area to sit or lie down on your left side. Lying on your left side provides the best blood and oxygen circulation to your baby. °· Write down the day and time on a sheet of paper or in a journal. °· Start counting kicks, flutters, swishes, rolls, or jabs in a 2-hour period. You should feel at least 10 movements within 2 hours. °· If you do not feel 10 movements in 2 hours, wait 2-3 hours and count again. Look for a change in the pattern or not enough counts in 2 hours. °SEEK MEDICAL CARE IF: °· You feel less than 10 counts in 2 hours, tried twice. °· There is no movement in over an hour. °· The pattern is changing or taking longer each day to reach 10 counts in 2 hours. °· You feel the baby is not moving as he or she usually does. °Date: ____________ Movements: ____________ Start time: ____________ Finish time: ____________  °Date: ____________ Movements: ____________ Start time: ____________ Finish time: ____________ °Date: ____________ Movements: ____________ Start time: ____________ Finish time: ____________ °Date: ____________ Movements: ____________ Start time: ____________ Finish time: ____________ °Date: ____________ Movements: ____________ Start time: ____________ Finish time: ____________ °Date: ____________ Movements: ____________ Start time: ____________ Finish time: ____________ °Date: ____________ Movements: ____________ Start time: ____________ Finish time: ____________ °Date: ____________ Movements: ____________ Start time: ____________ Finish time: ____________  °Date: ____________ Movements: ____________ Start time:  ____________ Finish time: ____________ °Date: ____________ Movements: ____________ Start time: ____________ Finish time: ____________ °Date: ____________ Movements: ____________ Start time: ____________ Finish time: ____________ °Date: ____________ Movements: ____________ Start time: ____________ Finish time: ____________ °Date: ____________ Movements: ____________ Start time: ____________ Finish time: ____________ °Date: ____________ Movements: ____________ Start time: ____________ Finish time: ____________ °Date: ____________ Movements: ____________ Start time: ____________ Finish time: ____________  °Date: ____________ Movements: ____________ Start time: ____________ Finish time: ____________ °Date: ____________ Movements: ____________ Start time: ____________ Finish time: ____________ °Date: ____________ Movements: ____________ Start time: ____________ Finish time: ____________ °Date: ____________ Movements: ____________ Start time: ____________ Finish time: ____________ °Date: ____________ Movements: ____________ Start time: ____________ Finish time: ____________ °Date: ____________ Movements: ____________ Start time: ____________ Finish time: ____________ °Date: ____________ Movements: ____________ Start time: ____________ Finish time:   ____________  °Date: ____________ Movements: ____________ Start time: ____________ Finish time: ____________ °Date: ____________ Movements: ____________ Start time: ____________ Finish time: ____________ °Date: ____________ Movements: ____________ Start time: ____________ Finish time: ____________ °Date: ____________ Movements: ____________ Start time: ____________ Finish time: ____________ °Date: ____________ Movements: ____________ Start time: ____________ Finish time: ____________ °Date: ____________ Movements: ____________ Start time: ____________ Finish time: ____________ °Date: ____________ Movements: ____________ Start time: ____________ Finish time: ____________  °Date:  ____________ Movements: ____________ Start time: ____________ Finish time: ____________ °Date: ____________ Movements: ____________ Start time: ____________ Finish time: ____________ °Date: ____________ Movements: ____________ Start time: ____________ Finish time: ____________ °Date: ____________ Movements: ____________ Start time: ____________ Finish time: ____________ °Date: ____________ Movements: ____________ Start time: ____________ Finish time: ____________ °Date: ____________ Movements: ____________ Start time: ____________ Finish time: ____________ °Date: ____________ Movements: ____________ Start time: ____________ Finish time: ____________  °Date: ____________ Movements: ____________ Start time: ____________ Finish time: ____________ °Date: ____________ Movements: ____________ Start time: ____________ Finish time: ____________ °Date: ____________ Movements: ____________ Start time: ____________ Finish time: ____________ °Date: ____________ Movements: ____________ Start time: ____________ Finish time: ____________ °Date: ____________ Movements: ____________ Start time: ____________ Finish time: ____________ °Date: ____________ Movements: ____________ Start time: ____________ Finish time: ____________ °Date: ____________ Movements: ____________ Start time: ____________ Finish time: ____________  °Date: ____________ Movements: ____________ Start time: ____________ Finish time: ____________ °Date: ____________ Movements: ____________ Start time: ____________ Finish time: ____________ °Date: ____________ Movements: ____________ Start time: ____________ Finish time: ____________ °Date: ____________ Movements: ____________ Start time: ____________ Finish time: ____________ °Date: ____________ Movements: ____________ Start time: ____________ Finish time: ____________ °Date: ____________ Movements: ____________ Start time: ____________ Finish time: ____________ °Date: ____________ Movements: ____________ Start  time: ____________ Finish time: ____________  °Date: ____________ Movements: ____________ Start time: ____________ Finish time: ____________ °Date: ____________ Movements: ____________ Start time: ____________ Finish time: ____________ °Date: ____________ Movements: ____________ Start time: ____________ Finish time: ____________ °Date: ____________ Movements: ____________ Start time: ____________ Finish time: ____________ °Date: ____________ Movements: ____________ Start time: ____________ Finish time: ____________ °Date: ____________ Movements: ____________ Start time: ____________ Finish time: ____________ °Document Released: 05/04/2006 Document Revised: 08/19/2013 Document Reviewed: 01/30/2012 °[employer]® Patient Information ©2015 [employer]. This information is not intended to replace advice given to you by your health care provider. Make sure you discuss any questions you have with your health care provider. ° °

## 2014-11-18 NOTE — MAU Note (Signed)
Pt presents to MAU vis EMS with complaints of contractions. Reports she was evaluated this morning and was 1 cm dilated. States she lost her mucous plug this morning.

## 2014-11-19 ENCOUNTER — Inpatient Hospital Stay (HOSPITAL_COMMUNITY): Payer: Medicaid Other | Admitting: Anesthesiology

## 2014-11-19 ENCOUNTER — Encounter (HOSPITAL_COMMUNITY): Payer: Self-pay | Admitting: *Deleted

## 2014-11-19 ENCOUNTER — Inpatient Hospital Stay (HOSPITAL_COMMUNITY)
Admission: AD | Admit: 2014-11-19 | Discharge: 2014-11-20 | DRG: 775 | Disposition: A | Discharge status: Home / Self Care | Payer: Medicaid Other | Source: Ambulatory Visit | Attending: Obstetrics and Gynecology | Admitting: Obstetrics and Gynecology

## 2014-11-19 DIAGNOSIS — IMO0001 Reserved for inherently not codable concepts without codable children: Secondary | ICD-10-CM

## 2014-11-19 DIAGNOSIS — D561 Beta thalassemia: Secondary | ICD-10-CM | POA: Diagnosis present

## 2014-11-19 DIAGNOSIS — Z3A00 Weeks of gestation of pregnancy not specified: Secondary | ICD-10-CM | POA: Diagnosis present

## 2014-11-19 DIAGNOSIS — O9902 Anemia complicating childbirth: Secondary | ICD-10-CM | POA: Diagnosis present

## 2014-11-19 DIAGNOSIS — Z809 Family history of malignant neoplasm, unspecified: Secondary | ICD-10-CM | POA: Diagnosis not present

## 2014-11-19 DIAGNOSIS — Z8249 Family history of ischemic heart disease and other diseases of the circulatory system: Secondary | ICD-10-CM | POA: Diagnosis not present

## 2014-11-19 LAB — CBC
HCT: 31.9 % — ABNORMAL LOW (ref 36.0–46.0)
Hemoglobin: 10.8 g/dL — ABNORMAL LOW (ref 12.0–15.0)
MCH: 26.9 pg (ref 26.0–34.0)
MCHC: 33.9 g/dL (ref 30.0–36.0)
MCV: 79.6 fL (ref 78.0–100.0)
Platelets: 212 10*3/uL (ref 150–400)
RBC: 4.01 MIL/uL (ref 3.87–5.11)
RDW: 14.9 % (ref 11.5–15.5)
WBC: 13.9 10*3/uL — ABNORMAL HIGH (ref 4.0–10.5)

## 2014-11-19 LAB — TYPE AND SCREEN
ABO/RH(D): O POS
Antibody Screen: NEGATIVE

## 2014-11-19 LAB — RPR: RPR: NONREACTIVE

## 2014-11-19 LAB — ABO/RH: ABO/RH(D): O POS

## 2014-11-19 MED ORDER — OXYTOCIN BOLUS FROM INFUSION
500.0000 mL | INTRAVENOUS | Status: DC
  Administered 2014-11-19: 500 mL via INTRAVENOUS

## 2014-11-19 MED ORDER — LIDOCAINE HCL (PF) 1 % IJ SOLN
INTRAMUSCULAR | Status: DC | PRN
  Administered 2014-11-19 (×2): 4 mL via EPIDURAL

## 2014-11-19 MED ORDER — SENNOSIDES-DOCUSATE SODIUM 8.6-50 MG PO TABS
2.0000 | ORAL_TABLET | ORAL | Status: DC
  Administered 2014-11-19: 2 via ORAL
  Filled 2014-11-19: qty 2

## 2014-11-19 MED ORDER — DIPHENHYDRAMINE HCL 50 MG/ML IJ SOLN: 12.5000 mg | INTRAMUSCULAR | Status: DC | PRN

## 2014-11-19 MED ORDER — PHENYLEPHRINE 40 MCG/ML (10ML) SYRINGE FOR IV PUSH (FOR BLOOD PRESSURE SUPPORT)
80.0000 ug | PREFILLED_SYRINGE | INTRAVENOUS | Status: DC | PRN
  Filled 2014-11-19: qty 20
  Filled 2014-11-19: qty 2
  Filled 2014-11-19: qty 20

## 2014-11-19 MED ORDER — FENTANYL 2.5 MCG/ML BUPIVACAINE 1/10 % EPIDURAL INFUSION (WH - ANES)
14.0000 mL/h | INTRAMUSCULAR | Status: DC | PRN
Start: 2014-11-19 — End: 2014-11-19

## 2014-11-19 MED ORDER — SODIUM CHLORIDE 0.9 % IV SOLN: 250.0000 mL | INTRAVENOUS | Status: DC | PRN

## 2014-11-19 MED ORDER — ZOLPIDEM TARTRATE 5 MG PO TABS: 5.0000 mg | ORAL_TABLET | Freq: Every evening | ORAL | Status: DC | PRN

## 2014-11-19 MED ORDER — EPHEDRINE 5 MG/ML INJ
10.0000 mg | INTRAVENOUS | Status: DC | PRN
  Filled 2014-11-19: qty 2

## 2014-11-19 MED ORDER — METHYLERGONOVINE MALEATE 0.2 MG PO TABS: 0.2000 mg | ORAL_TABLET | ORAL | Status: DC | PRN

## 2014-11-19 MED ORDER — PRENATAL MULTIVITAMIN CH
1.0000 | ORAL_TABLET | Freq: Every day | ORAL | Status: DC
  Administered 2014-11-19 – 2014-11-20 (×2): 1 via ORAL
  Filled 2014-11-19 (×2): qty 1

## 2014-11-19 MED ORDER — DIBUCAINE 1 % RE OINT: 1.0000 "application " | TOPICAL_OINTMENT | RECTAL | Status: DC | PRN

## 2014-11-19 MED ORDER — LANOLIN HYDROUS EX OINT: TOPICAL_OINTMENT | CUTANEOUS | Status: DC | PRN

## 2014-11-19 MED ORDER — FERROUS SULFATE 325 (65 FE) MG PO TABS
325.0000 mg | ORAL_TABLET | Freq: Two times a day (BID) | ORAL | Status: DC
  Administered 2014-11-19 – 2014-11-20 (×2): 325 mg via ORAL
  Filled 2014-11-19 (×3): qty 1

## 2014-11-19 MED ORDER — ONDANSETRON HCL 4 MG PO TABS: 4.0000 mg | ORAL_TABLET | ORAL | Status: DC | PRN

## 2014-11-19 MED ORDER — TETANUS-DIPHTH-ACELL PERTUSSIS 5-2.5-18.5 LF-MCG/0.5 IM SUSP: 0.5000 mL | Freq: Once | INTRAMUSCULAR | Status: DC

## 2014-11-19 MED ORDER — METHYLERGONOVINE MALEATE 0.2 MG/ML IJ SOLN: 0.2000 mg | INTRAMUSCULAR | Status: DC | PRN

## 2014-11-19 MED ORDER — MEASLES, MUMPS & RUBELLA VAC ~~LOC~~ INJ
0.5000 mL | INJECTION | Freq: Once | SUBCUTANEOUS | Status: DC
Start: 2014-11-20 — End: 2014-11-20
  Filled 2014-11-19: qty 0.5

## 2014-11-19 MED ORDER — DIPHENHYDRAMINE HCL 25 MG PO CAPS: 25.0000 mg | ORAL_CAPSULE | Freq: Four times a day (QID) | ORAL | Status: DC | PRN

## 2014-11-19 MED ORDER — ONDANSETRON HCL 4 MG/2ML IJ SOLN: 4.0000 mg | INTRAMUSCULAR | Status: DC | PRN

## 2014-11-19 MED ORDER — SIMETHICONE 80 MG PO CHEW: 80.0000 mg | CHEWABLE_TABLET | ORAL | Status: DC | PRN

## 2014-11-19 MED ORDER — SODIUM CHLORIDE 0.9 % IJ SOLN: 3.0000 mL | Freq: Two times a day (BID) | INTRAMUSCULAR | Status: DC

## 2014-11-19 MED ORDER — IBUPROFEN 800 MG PO TABS
800.0000 mg | ORAL_TABLET | Freq: Three times a day (TID) | ORAL | Status: DC
  Administered 2014-11-19 – 2014-11-20 (×5): 800 mg via ORAL
  Filled 2014-11-19 (×6): qty 1

## 2014-11-19 MED ORDER — ACETAMINOPHEN 325 MG PO TABS: 650.0000 mg | ORAL_TABLET | ORAL | Status: DC | PRN

## 2014-11-19 MED ORDER — LACTATED RINGERS IV SOLN: INTRAVENOUS | Status: DC

## 2014-11-19 MED ORDER — ONDANSETRON HCL 4 MG/2ML IJ SOLN: 4.0000 mg | Freq: Four times a day (QID) | INTRAMUSCULAR | Status: DC | PRN

## 2014-11-19 MED ORDER — FENTANYL 2.5 MCG/ML BUPIVACAINE 1/10 % EPIDURAL INFUSION (WH - ANES)
14.0000 mL/h | INTRAMUSCULAR | Status: DC | PRN
  Administered 2014-11-19: 14 mL/h via EPIDURAL
  Filled 2014-11-19: qty 125

## 2014-11-19 MED ORDER — OXYCODONE-ACETAMINOPHEN 5-325 MG PO TABS: 1.0000 | ORAL_TABLET | ORAL | Status: DC | PRN

## 2014-11-19 MED ORDER — OXYCODONE-ACETAMINOPHEN 5-325 MG PO TABS
1.0000 | ORAL_TABLET | ORAL | Status: DC | PRN
  Administered 2014-11-19: 1 via ORAL
  Filled 2014-11-19: qty 1

## 2014-11-19 MED ORDER — BUTORPHANOL TARTRATE 1 MG/ML IJ SOLN: 1.0000 mg | INTRAMUSCULAR | Status: DC | PRN

## 2014-11-19 MED ORDER — BENZOCAINE-MENTHOL 20-0.5 % EX AERO
1.0000 "application " | INHALATION_SPRAY | CUTANEOUS | Status: DC | PRN
  Administered 2014-11-19: 1 via TOPICAL
  Filled 2014-11-19: qty 56

## 2014-11-19 MED ORDER — LACTATED RINGERS IV SOLN
INTRAVENOUS | Status: DC
  Administered 2014-11-19 (×2): via INTRAVENOUS

## 2014-11-19 MED ORDER — WITCH HAZEL-GLYCERIN EX PADS
1.0000 "application " | MEDICATED_PAD | CUTANEOUS | Status: DC | PRN
  Administered 2014-11-19: 1 via TOPICAL

## 2014-11-19 MED ORDER — SODIUM CHLORIDE 0.9 % IJ SOLN: 3.0000 mL | INTRAMUSCULAR | Status: DC | PRN

## 2014-11-19 MED ORDER — OXYCODONE-ACETAMINOPHEN 5-325 MG PO TABS: 2.0000 | ORAL_TABLET | ORAL | Status: DC | PRN

## 2014-11-19 MED ORDER — LIDOCAINE HCL (PF) 1 % IJ SOLN
30.0000 mL | INTRAMUSCULAR | Status: DC | PRN
  Administered 2014-11-19: 30 mL via SUBCUTANEOUS
  Filled 2014-11-19: qty 30

## 2014-11-19 MED ORDER — OXYTOCIN 40 UNITS IN LACTATED RINGERS INFUSION - SIMPLE MED
62.5000 mL/h | INTRAVENOUS | Status: DC
  Filled 2014-11-19: qty 1000

## 2014-11-19 MED ORDER — CITRIC ACID-SODIUM CITRATE 334-500 MG/5ML PO SOLN: 30.0000 mL | ORAL | Status: DC | PRN

## 2014-11-19 MED ORDER — LACTATED RINGERS IV SOLN
500.0000 mL | INTRAVENOUS | Status: DC | PRN
  Administered 2014-11-19: 500 mL via INTRAVENOUS

## 2014-11-19 MED ORDER — PHENYLEPHRINE 40 MCG/ML (10ML) SYRINGE FOR IV PUSH (FOR BLOOD PRESSURE SUPPORT): 80.0000 ug | PREFILLED_SYRINGE | INTRAVENOUS | Status: DC | PRN

## 2014-11-19 MED ORDER — MAGNESIUM HYDROXIDE 400 MG/5ML PO SUSP: 30.0000 mL | ORAL | Status: DC | PRN

## 2014-11-19 NOTE — Progress Notes (Signed)
CSW received consult for hx of Anxiety.  CSW completed chart review.  Hx of Anxiety is not noted in PNR or H&P.  No mention of Anxiety in MAU notes.  CSW screening out referral at this time.  CSW spoke with bedside RN, who is not aware of the hx, and asked to be contacted if concerns arise. 

## 2014-11-19 NOTE — MAU Note (Signed)
PT SAYS  HURTING  BAD AT 7PM.   VE IN OFFICE  1 CM.   DENIES HSV AND  MRSA  .  GBS-  UNSURE

## 2014-11-19 NOTE — H&P (Signed)
Angela Acosta is a 19 y.o. female presenting for regular painful ctx, SROM in MAU. No vaginal bleeding, normal fetal movement  Maternal Medical History:  Reason for admission: Rupture of membranes and contractions.  Nausea.  Contractions: Onset was 3-5 hours ago.   Frequency: regular.   Perceived severity is strong.    Fetal activity: Perceived fetal activity is normal.   Last perceived fetal movement was within the past 12 hours.    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    OB History    Gravida Para Term Preterm AB TAB SAB Ectopic Multiple Living   1         0     Past Medical History  Diagnosis Date  . Heart murmur    Past Surgical History  Procedure Laterality Date  . No past surgeries     Family History: family history includes Cancer in her paternal grandmother; Hypertension in her father. Social History:  reports that she has never smoked. She has never used smokeless tobacco. She reports that she does not drink alcohol or use illicit drugs.   Prenatal Transfer Tool  Maternal Diabetes: No Genetic Screening: Normal Maternal Ultrasounds/Referrals: Normal Fetal Ultrasounds or other Referrals:  None Maternal Substance Abuse:  No Significant Maternal Medications:  Meds include: Other: iron Significant Maternal Lab Results:  Lab values include: Group B Strep negative Other Comments:  None  Review of Systems  Constitutional: Negative for fever and chills.  Eyes: Negative for blurred vision and double vision.  Respiratory: Negative for cough.   Cardiovascular: Negative for chest pain and palpitations.  Gastrointestinal: Negative for heartburn and nausea.  Genitourinary: Negative for dysuria and urgency.  Skin: Negative for rash.  Neurological: Negative for headaches.  Endo/Heme/Allergies: Does not bruise/bleed easily.  Psychiatric/Behavioral: Negative for depression.  All other systems reviewed and are  negative.   Dilation: 1 Effacement (%): 80 Station: -2 Exam by:: DCALLAWAY, RN Blood pressure 106/69, pulse 77, temperature 98.6 F (37 C), temperature source Oral, resp. rate 15, height 5' 6.75" (1.695 m), weight 65.772 kg (145 lb), last menstrual period 02/16/2014, SpO2 100 %. Maternal Exam:  Uterine Assessment: Contraction strength is moderate.  Contraction duration is 60 seconds. Contraction frequency is regular.   Abdomen: Patient reports no abdominal tenderness. Fundal height is 39 cm.   Estimated fetal weight is 2800 grams.   Fetal presentation: vertex  Introitus: Normal vulva. Normal vagina.  Ferning test: positive.  Nitrazine test: positive. Amniotic fluid character: clear.  Pelvis: adequate for delivery.   Cervix: Cervix evaluated by digital exam.   1/80/-2  Fetal Exam Fetal Monitor Review: Baseline rate: 141.  Variability: moderate (6-25 bpm).   Pattern: accelerations present and no decelerations.    Fetal State Assessment: Category I - tracings are normal.     Physical Exam  Vitals reviewed. Constitutional: She is oriented to person, place, and time. She appears well-developed and well-nourished.  HENT:  Head: Normocephalic.  Cardiovascular: Normal rate and regular rhythm.   Respiratory: Effort normal.  Musculoskeletal: Normal range of motion.  Neurological: She is alert and oriented to person, place, and time.    Prenatal labs: ABO, Rh:  O+ Antibody:  neg Rubella:  IMM RPR:   NR HBsAg:   Neg HIV:   Neg GBS:   Neg  Assessment/Plan: 19 yo G1P0 at 39 weeks 2 days in early labor SROM Prenatal complications include Betal thal trait and anemia Admit to L&D  Continuous  monitoring IV Stadol prn, patient may have epidural on demand Active management of labor, if ctx stall will augment with pitocin    Keiarra Charon STACIA 11/19/2014, 2:28 AM

## 2014-11-19 NOTE — Anesthesia Preprocedure Evaluation (Signed)
Anesthesia Evaluation  Patient identified by MRN, date of birth, ID band Patient awake    Reviewed: Allergy & Precautions, H&P , Patient's Chart, lab work & pertinent test results  Airway Mallampati: III  TM Distance: >3 FB Neck ROM: full    Dental no notable dental hx. (+) Teeth Intact   Pulmonary neg pulmonary ROS,  breath sounds clear to auscultation  Pulmonary exam normal       Cardiovascular negative cardio ROS Normal cardiovascular exam+ Valvular Problems/Murmurs Rhythm:regular Rate:Normal     Neuro/Psych negative neurological ROS  negative psych ROS   GI/Hepatic negative GI ROS, Neg liver ROS,   Endo/Other  negative endocrine ROS  Renal/GU negative Renal ROS  negative genitourinary   Musculoskeletal   Abdominal   Peds  Hematology  (+) anemia ,   Anesthesia Other Findings   Reproductive/Obstetrics (+) Pregnancy                             Anesthesia Physical Anesthesia Plan  ASA: II  Anesthesia Plan: Epidural   Post-op Pain Management:    Induction:   Airway Management Planned:   Additional Equipment:   Intra-op Plan:   Post-operative Plan:   Informed Consent: I have reviewed the patients History and Physical, chart, labs and discussed the procedure including the risks, benefits and alternatives for the proposed anesthesia with the patient or authorized representative who has indicated his/her understanding and acceptance.     Plan Discussed with: Anesthesiologist  Anesthesia Plan Comments:         Anesthesia Quick Evaluation

## 2014-11-19 NOTE — Lactation Note (Signed)
This note was copied from the chart of Angela Acosta. Lactation Consultation Note Initial visit at 9 hours of age.  Mom reports several feedings without nipple pain, but feels strong sucking.  Mom has large soft breasts with very large areolas.  Demonstrated hand expression and noted breasts are compressible, but nipples inverts some with compression.  Mom reports nipples are round and everted after feedings.  Offered hand expression to use as needed.  Slight redness noted in center of left nipple after hand pump used possible inverted in center of nipples. Encouraged mom to hand express and use colostrum on nipples as needed.  Baby STS just finished a 16 minutes feedings.    [facility] LC resources given and discussed.  Encouraged to feed with early cues on demand.  Early newborn behavior discussed.  Mom to call for assist as needed. Report to [facility] RN at bedside.     Patient Name: Angela Acosta [id number] Date: 11/19/2014 Reason for consult: Initial assessment   Maternal Data Has patient been taught Hand Expression?: Yes Does the patient have breastfeeding experience prior to this delivery?: No  Feeding Feeding Type: Breast Fed Length of feed: 0 min  LATCH Score/Interventions Latch: Repeated attempts needed to sustain latch, nipple held in mouth throughout feeding, stimulation needed to elicit sucking reflex. Intervention(s): Adjust position;Assist with latch  Audible Swallowing: A few with stimulation Intervention(s): Skin to skin;Hand expression  Type of Nipple: Everted at rest and after stimulation  Comfort (Breast/Nipple): Soft / non-tender     Hold (Positioning): Full assist, staff holds infant at breast  LATCH Score: 6  Lactation Tools Discussed/Used Initiated by:: JS Date initiated:: 11/20/14   Consult Status Consult Status: Follow-up Date: 11/20/14 Follow-up type: In-patient    Jannifer Rodney 11/19/2014, 5:43 PM

## 2014-11-19 NOTE — Anesthesia Procedure Notes (Signed)
Epidural Patient location during procedure: OB Start time: 11/19/2014 3:49 AM  Staffing Anesthesiologist: Mal Amabile Performed by: anesthesiologist   Preanesthetic Checklist Completed: patient identified, site marked, surgical consent, pre-op evaluation, timeout performed, IV checked, risks and benefits discussed and monitors and equipment checked  Epidural Patient position: sitting Prep: site prepped and draped and DuraPrep Patient monitoring: continuous pulse ox and blood pressure Approach: midline Location: L3-L4 Injection technique: LOR air  Needle:  Needle type: Tuohy  Needle gauge: 17 G Needle length: 9 cm and 9 Needle insertion depth: 4 cm Catheter type: closed end flexible Catheter size: 19 Gauge Catheter at skin depth: 9 cm Test dose: negative and Other  Assessment Events: blood not aspirated, injection not painful, no injection resistance, negative IV test and no paresthesia  Additional Notes Patient identified. Risks and benefits discussed including failed block, incomplete  Pain control, post dural puncture headache, nerve damage, paralysis, blood pressure Changes, nausea, vomiting, reactions to medications-both toxic and allergic and post Partum back pain. All questions were answered. Patient expressed understanding and wished to proceed. Sterile technique was used throughout procedure. Epidural site was Dressed with sterile barrier dressing. No paresthesias, signs of intravascular injection Or signs of intrathecal spread were encountered.  Patient was more comfortable after the epidural was dosed. Please see RN's note for documentation of vital signs and FHR which are stable.

## 2014-11-19 NOTE — Anesthesia Postprocedure Evaluation (Signed)
  Anesthesia Post-op Note  Patient: Angela Acosta  Procedure(s) Performed: * No procedures listed *  Patient Location: Mother/Baby  Anesthesia Type:Epidural  Level of Consciousness: awake, alert  and oriented  Airway and Oxygen Therapy: Patient Spontanous Breathing  Post-op Pain: none  Post-op Assessment: Post-op Vital signs reviewed and Patient's Cardiovascular Status Stable              Post-op Vital Signs: Reviewed and stable  Last Vitals:  Filed Vitals:   11/19/14 1122  BP: 112/52  Pulse: 72  Temp: 36.6 C  Resp: 18    Complications: No apparent anesthesia complications

## 2014-11-19 NOTE — MAU Note (Signed)
Pt reports contractions that are worsening. Denies bleeding or ROM.

## 2014-11-20 LAB — CBC
HEMATOCRIT: 24.5 % — AB (ref 36.0–46.0)
HEMOGLOBIN: 8.2 g/dL — AB (ref 12.0–15.0)
MCH: 26.7 pg (ref 26.0–34.0)
MCHC: 33.5 g/dL (ref 30.0–36.0)
MCV: 79.8 fL (ref 78.0–100.0)
PLATELETS: 162 10*3/uL (ref 150–400)
RBC: 3.07 MIL/uL — ABNORMAL LOW (ref 3.87–5.11)
RDW: 15 % (ref 11.5–15.5)
WBC: 13.6 10*3/uL — ABNORMAL HIGH (ref 4.0–10.5)

## 2014-11-20 MED ORDER — IBUPROFEN 800 MG PO TABS: 800.0000 mg | ORAL_TABLET | Freq: Three times a day (TID) | ORAL | Status: DC

## 2014-11-20 NOTE — Lactation Note (Signed)
This note was copied from the chart of Angela Damisha Finklea. Lactation Consultation Note   Mother using #20NS.  Baby very sleepy at breast.  He needs of stimulation to keep feeding. Reviewed waking techniques and encouraged mother to massage breast to keep him active. Sucks and a few swallows observed. Set up DEBP.  Suggest mother post pump 4-6xday for 10-15 min and give baby back volume pumped at next feeding. Reviewed spoon, foley cup and syringe.   Mother's sister in room with 102 month old baby who is still breastfeeding and using NS.  Very supportive about situation and pumping. Discussed milk storage and cleaning. Encouraged her to call if she needs further assistance.  Patient Name: Angela Acosta [id number] Date: 11/20/2014 Reason for consult: Follow-up assessment   Maternal Data    Feeding Feeding Type: Breast Fed Length of feed: 0 min  LATCH Score/Interventions Latch: Repeated attempts needed to sustain latch, nipple held in mouth throughout feeding, stimulation needed to elicit sucking reflex. Intervention(s): Adjust position;Assist with latch;Breast massage;Breast compression  Audible Swallowing: A few with stimulation  Type of Nipple: Flat  Comfort (Breast/Nipple): Soft / non-tender     Hold (Positioning): Assistance needed to correctly position infant at breast and maintain latch.  LATCH Score: 6  Lactation Tools Discussed/Used Tools: Nipple Shields Nipple shield size: 20 Pump Review: Setup, frequency, and cleaning;Milk Storage Initiated by:: Dahlia Byes RN Date initiated:: 11/20/14   Consult Status Consult Status: Follow-up Date: 11/21/14 Follow-up type: In-patient    Dahlia Byes [facility] 11/20/2014, 3:12 PM

## 2014-11-20 NOTE — Progress Notes (Signed)
PPD#1 Pt is doing well She would like to go home. VSSAF IMP/ Doing well Plan/ Will discharge to home

## 2014-11-20 NOTE — Lactation Note (Signed)
This note was copied from the chart of Angela Berlynn Rogue. Lactation Consultation Note  Patient Name: Angela Acosta [id number] Date: 11/20/2014 Reason for consult: Follow-up assessment;Difficult latch Mom reports baby sleepy and not wanting to latch today. Last feeding Mom reports was around 0930 this am. Mom has flat nipples that are compressible. Tried several position to get baby to sustain the latch but he could not. Initiated #16 nipple shield, baby began suckling, some dimpling noted. Reviewed with Mom how to apply nipple shield. Advised want to see colostrum in nipple shield. LC's will continue to work with Mom on latch and set up DEBP for her to post pump to encourage milk production.   Maternal Data    Feeding Feeding Type: Breast Fed Length of feed: 0 min  LATCH Score/Interventions Latch: Repeated attempts needed to sustain latch, nipple held in mouth throughout feeding, stimulation needed to elicit sucking reflex. (baby could not sustain latch, initiated NS) Intervention(s): Adjust position;Assist with latch;Breast massage;Breast compression  Audible Swallowing: None  Type of Nipple: Flat  Comfort (Breast/Nipple): Soft / non-tender     Hold (Positioning): Assistance needed to correctly position infant at breast and maintain latch.  LATCH Score: 5  Lactation Tools Discussed/Used Tools: Nipple Shields Nipple shield size: 16;20   Consult Status Consult Status: Follow-up Date: 11/20/14 Follow-up type: In-patient    Angela Acosta 11/20/2014, 2:05 PM

## 2014-11-20 NOTE — Discharge Summary (Signed)
Obstetric Discharge Summary Reason for Admission: onset of labor and rupture of membranes Prenatal Procedures: ultrasound Intrapartum Procedure: Vacuum delivery Postpartum Procedures None Complications-Operative and Postpartum: 2nd degree perineal laceration HEMOGLOBIN  Date Value Ref Range Status  11/19/2014 10.8* 12.0 - 15.0 g/dL Final   HCT  Date Value Ref Range Status  11/19/2014 31.9* 36.0 - 46.0 % Final    Physical Exam:  General: alert Lochia: appropriate Uterine Fundus: firm   Discharge Diagnoses: Term Pregnancy-delivered  Discharge Information: Date: 11/20/2014 Activity: pelvic rest Diet: routine Medications: PNV, Ibuprofen and Iron Condition: stable Instructions: refer to practice specific booklet Discharge to: home Follow-up Information    Follow up with [facility], Sanjuana Mae, MD. Schedule an appointment as soon as possible for a visit in 1 month.   Specialty:  Obstetrics and Gynecology   Contact information:   [address] [place] [address] [phone]       Newborn Data: Live born female  Birth Weight: 7 lb 12.5 oz (3530 g) APGAR: 9, 9  Home with mother.  ANDERSON,MARK E 11/20/2014, 7:10 AM

## 2015-02-08 ENCOUNTER — Emergency Department (HOSPITAL_COMMUNITY)
Admission: EM | Admit: 2015-02-08 | Discharge: 2015-02-08 | Disposition: A | Discharge status: Home / Self Care | Payer: Medicaid Other | Attending: Emergency Medicine | Admitting: Emergency Medicine

## 2015-02-08 ENCOUNTER — Encounter (HOSPITAL_COMMUNITY): Payer: Self-pay | Admitting: Emergency Medicine

## 2015-02-08 DIAGNOSIS — R011 Cardiac murmur, unspecified: Secondary | ICD-10-CM | POA: Diagnosis not present

## 2015-02-08 DIAGNOSIS — Z8659 Personal history of other mental and behavioral disorders: Secondary | ICD-10-CM | POA: Insufficient documentation

## 2015-02-08 DIAGNOSIS — Z79899 Other long term (current) drug therapy: Secondary | ICD-10-CM | POA: Diagnosis not present

## 2015-02-08 DIAGNOSIS — Z791 Long term (current) use of non-steroidal anti-inflammatories (NSAID): Secondary | ICD-10-CM | POA: Diagnosis not present

## 2015-02-08 DIAGNOSIS — Y9389 Activity, other specified: Secondary | ICD-10-CM | POA: Diagnosis not present

## 2015-02-08 DIAGNOSIS — S6992XA Unspecified injury of left wrist, hand and finger(s), initial encounter: Secondary | ICD-10-CM | POA: Diagnosis present

## 2015-02-08 DIAGNOSIS — Y998 Other external cause status: Secondary | ICD-10-CM | POA: Insufficient documentation

## 2015-02-08 DIAGNOSIS — X58XXXA Exposure to other specified factors, initial encounter: Secondary | ICD-10-CM | POA: Insufficient documentation

## 2015-02-08 DIAGNOSIS — S63502A Unspecified sprain of left wrist, initial encounter: Secondary | ICD-10-CM | POA: Insufficient documentation

## 2015-02-08 DIAGNOSIS — Y9289 Other specified places as the place of occurrence of the external cause: Secondary | ICD-10-CM | POA: Insufficient documentation

## 2015-02-08 NOTE — Discharge Instructions (Signed)
Cryotherapy °Cryotherapy means treatment with cold. Ice or gel packs can be used to reduce both pain and swelling. Ice is the most helpful within the first 24 to 48 hours after an injury or flare-up from overusing a muscle or joint. Sprains, strains, spasms, burning pain, shooting pain, and aches can all be eased with ice. Ice can also be used when recovering from surgery. Ice is effective, has very few side effects, and is safe for most people to use. °PRECAUTIONS  °Ice is not a safe treatment option for people with: °· Raynaud phenomenon. This is a condition affecting small blood vessels in the extremities. Exposure to cold may cause your problems to return. °· Cold hypersensitivity. There are many forms of cold hypersensitivity, including: °· Cold urticaria. Red, itchy hives appear on the skin when the tissues begin to warm after being iced. °· Cold erythema. This is a red, itchy rash caused by exposure to cold. °· Cold hemoglobinuria. Red blood cells break down when the tissues begin to warm after being iced. The hemoglobin that carry oxygen are passed into the urine because they cannot combine with blood proteins fast enough. °· Numbness or altered sensitivity in the area being iced. °If you have any of the following conditions, do not use ice until you have discussed cryotherapy with your caregiver: °· Heart conditions, such as arrhythmia, angina, or chronic heart disease. °· High blood pressure. °· Healing wounds or open skin in the area being iced. °· Current infections. °· Rheumatoid arthritis. °· Poor circulation. °· Diabetes. °Ice slows the blood flow in the region it is applied. This is beneficial when trying to stop inflamed tissues from spreading irritating chemicals to surrounding tissues. However, if you expose your skin to cold temperatures for too long or without the proper protection, you can damage your skin or nerves. Watch for signs of skin damage due to cold. °HOME CARE INSTRUCTIONS °Follow  these tips to use ice and cold packs safely. °· Place a dry or damp towel between the ice and skin. A damp towel will cool the skin more quickly, so you may need to shorten the time that the ice is used. °· For a more rapid response, add gentle compression to the ice. °· Ice for no more than 10 to 20 minutes at a time. The bonier the area you are icing, the less time it will take to get the benefits of ice. °· Check your skin after 5 minutes to make sure there are no signs of a poor response to cold or skin damage. °· Rest 20 minutes or more between uses. °· Once your skin is numb, you can end your treatment. You can test numbness by very lightly touching your skin. The touch should be so light that you do not see the skin dimple from the pressure of your fingertip. When using ice, most people will feel these normal sensations in this order: cold, burning, aching, and numbness. °· Do not use ice on someone who cannot communicate their responses to pain, such as small children or people with dementia. °HOW TO MAKE AN ICE PACK °Ice packs are the most common way to use ice therapy. Other methods include ice massage, ice baths, and cryosprays. Muscle creams that cause a cold, tingly feeling do not offer the same benefits that ice offers and should not be used as a substitute unless recommended by your caregiver. °To make an ice pack, do one of the following: °· Place crushed ice or a   bag of frozen vegetables in a sealable plastic bag. Squeeze out the excess air. Place this bag inside another plastic bag. Slide the bag into a pillowcase or place a damp towel between your skin and the bag.  Mix 3 parts water with 1 part rubbing alcohol. Freeze the mixture in a sealable plastic bag. When you remove the mixture from the freezer, it will be slushy. Squeeze out the excess air. Place this bag inside another plastic bag. Slide the bag into a pillowcase or place a damp towel between your skin and the bag. SEEK MEDICAL CARE  IF:  You develop white spots on your skin. This may give the skin a blotchy (mottled) appearance.  Your skin turns blue or pale.  Your skin becomes waxy or hard.  Your swelling gets worse. MAKE SURE YOU:   Understand these instructions.  Will watch your condition.  Will get help right away if you are not doing well or get worse.   This information is not intended to replace advice given to you by your health care provider. Make sure you discuss any questions you have with your health care provider.   Document Released: 11/29/2010 Document Revised: 04/25/2014 Document Reviewed: 11/29/2010 Elsevier Interactive Patient Education 2016 [employer] Wrist Pain There are many things that can cause wrist pain. Some common causes include:  An injury to the wrist area, such as a sprain, strain, or fracture.  Overuse of the joint.  A condition that causes increased pressure on a nerve in the wrist (carpal tunnel syndrome).  Wear and tear of the joints that occurs with aging (osteoarthritis).  A variety of other types of arthritis. Sometimes, the cause of wrist pain is not known. The pain often goes away when you follow your health care provider's instructions for relieving pain at home. If your wrist pain continues, tests may need to be done to diagnose your condition. HOME CARE INSTRUCTIONS Pay attention to any changes in your symptoms. Take these actions to help with your pain:  Rest the wrist area for at least 48 hours or as told by your health care provider.  If directed, apply ice to the injured area:  Put ice in a plastic bag.  Place a towel between your skin and the bag.  Leave the ice on for 20 minutes, 2-3 times per day.  Keep your arm raised (elevated) above the level of your heart while you are sitting or lying down.  If a splint or elastic bandage has been applied, use it as told by your health care provider.  Remove the splint or bandage only as told by your  health care provider.  Loosen the splint or bandage if your fingers become numb or have a tingling feeling, or if they turn cold or blue.  Take over-the-counter and prescription medicines only as told by your health care provider.  Keep all follow-up visits as told by your health care provider. This is important. SEEK MEDICAL CARE IF:  Your pain is not helped by treatment.  Your pain gets worse. SEEK IMMEDIATE MEDICAL CARE IF:  Your fingers become swollen.  Your fingers turn white, very red, or cold and blue.  Your fingers are numb or have a tingling feeling.  You have difficulty moving your fingers.   This information is not intended to replace advice given to you by your health care provider. Make sure you discuss any questions you have with your health care provider.   Document Released: 01/12/2005 Document Revised:  12/24/2014 Document Reviewed: 08/20/2014 Elsevier Interactive Patient Education [employer].

## 2015-02-08 NOTE — ED Notes (Signed)
Pt from home states she picked up something heavy and "thinks she turned her left wrist funny". No obvious deformity or swelling and full range of motion to both wrists.

## 2015-02-08 NOTE — ED Provider Notes (Signed)
CSN: [id number]     Arrival date & time 02/08/15  2213 History   First MD Initiated Contact with Patient 02/08/15 2228     Chief Complaint  Patient presents with  . Wrist Pain    left wrist     (Consider location/radiation/quality/duration/timing/severity/associated sxs/prior Treatment) Patient is a 19 y.o. female presenting with wrist pain. The history is provided by the patient. No language interpreter was used.  Wrist Pain This is a new problem. The current episode started today. Pertinent negatives include no chills or fever. Associated symptoms comments: Left wrist pain after heavy lifting and twisting earlier today. No other injury. No numbness or weakness. .    Past Medical History  Diagnosis Date  . Heart murmur   . Anxiety     no medications   Past Surgical History  Procedure Laterality Date  . No past surgeries     Family History  Problem Relation Age of Onset  . Hypertension Father   . Cancer Paternal Grandmother    Social History  Substance Use Topics  . Smoking status: Never Smoker   . Smokeless tobacco: Never Used  . Alcohol Use: No   OB History    Gravida Para Term Preterm AB TAB SAB Ectopic Multiple Living   1 1 1       0 1     Review of Systems  Constitutional: Negative for fever and chills.  HENT: Negative.   Respiratory: Negative.   Cardiovascular: Negative.   Gastrointestinal: Negative.   Musculoskeletal:       See HPI.  Skin: Negative.   Neurological: Negative.       Allergies  Review of patient's allergies indicates no known allergies.  Home Medications   Prior to Admission medications   Medication Sig Start Date End Date Taking? Authorizing Provider  docusate sodium (COLACE) 100 MG capsule Take 100 mg by mouth daily as needed for mild constipation.    Historical Provider, MD  hydrocortisone cream 1 % Apply 1 application topically as needed for itching (dry skin).    Historical Provider, MD  ibuprofen (ADVIL,MOTRIN) 800 MG tablet  Take 1 tablet (800 mg total) by mouth 3 (three) times daily. 11/20/14   Levi AlandMark E Anderson, MD  IRON PO Take 1 tablet by mouth daily.    Historical Provider, MD  prenatal vitamin w/FE, FA (NATACHEW) 29-1 MG CHEW chewable tablet Chew 1 tablet by mouth daily at 12 noon.    Historical Provider, MD   BP 114/63 mmHg  Pulse 73  Temp(Src) 98.2 F (36.8 C) (Oral)  Resp 16  Ht 5\' 6"  (1.676 m)  Wt 132 lb (59.875 kg)  BMI 21.32 kg/m2  SpO2 100% Physical Exam  Constitutional: She is oriented to person, place, and time. She appears well-developed and well-nourished.  Neck: Normal range of motion.  Pulmonary/Chest: Effort normal.  Musculoskeletal:  Left wrist is not swelling. There is tenderness to ulnar aspect. FROM without deficit or weakness.   Neurological: She is alert and oriented to person, place, and time.  Skin: Skin is warm and dry.    ED Course  Procedures (including critical care time) Labs Review Labs Reviewed - No data to display  Imaging Review No results found. I have personally reviewed and evaluated these images and lab results as part of my medical decision-making.   EKG Interpretation None      MDM   Final diagnoses:  None    1. Left wrist sprain  Uncomplicated wrist sprain. Velcro splint  provided.     Elpidio Anis, PA-C 02/08/15 2248  Donnetta Hutching, MD 02/08/15 [phone]

## 2015-06-19 ENCOUNTER — Telehealth (HOSPITAL_COMMUNITY): Payer: Self-pay

## 2015-06-19 NOTE — Telephone Encounter (Signed)
Cheri RousJazmon has concerns regarding low milk supply.  Her baby is 657 months old and she has noticed a decrease in supply in the past 3 weeks. She has purchased Mother love more milk plus and wanted to know if it was safe.  She was told that it was made for BF mothers.  Discussed a feeding and pumping plan and encouraged BF support group. She was satisfied with the plan.

## 2015-06-20 ENCOUNTER — Encounter (HOSPITAL_COMMUNITY): Payer: Self-pay | Admitting: *Deleted

## 2015-06-20 ENCOUNTER — Inpatient Hospital Stay (HOSPITAL_COMMUNITY)
Admission: AD | Admit: 2015-06-20 | Discharge: 2015-06-20 | Disposition: A | Discharge status: Home / Self Care | Payer: Medicaid Other | Source: Ambulatory Visit | Attending: Obstetrics | Admitting: Obstetrics

## 2015-06-20 ENCOUNTER — Telehealth: Payer: Self-pay | Admitting: Obstetrics and Gynecology

## 2015-06-20 DIAGNOSIS — Z3202 Encounter for pregnancy test, result negative: Secondary | ICD-10-CM | POA: Insufficient documentation

## 2015-06-20 DIAGNOSIS — A499 Bacterial infection, unspecified: Secondary | ICD-10-CM | POA: Diagnosis not present

## 2015-06-20 DIAGNOSIS — N760 Acute vaginitis: Secondary | ICD-10-CM | POA: Diagnosis not present

## 2015-06-20 DIAGNOSIS — N898 Other specified noninflammatory disorders of vagina: Secondary | ICD-10-CM

## 2015-06-20 DIAGNOSIS — B9689 Other specified bacterial agents as the cause of diseases classified elsewhere: Secondary | ICD-10-CM

## 2015-06-20 LAB — URINALYSIS, ROUTINE W REFLEX MICROSCOPIC
BILIRUBIN URINE: NEGATIVE
Glucose, UA: NEGATIVE mg/dL
Hgb urine dipstick: NEGATIVE
Ketones, ur: NEGATIVE mg/dL
NITRITE: NEGATIVE
Protein, ur: NEGATIVE mg/dL
SPECIFIC GRAVITY, URINE: 1.015 (ref 1.005–1.030)
pH: 5.5 (ref 5.0–8.0)

## 2015-06-20 LAB — WET PREP, GENITAL
SPERM: NONE SEEN
Trich, Wet Prep: NONE SEEN
Yeast Wet Prep HPF POC: NONE SEEN

## 2015-06-20 LAB — POCT PREGNANCY, URINE: PREG TEST UR: NEGATIVE

## 2015-06-20 LAB — URINE MICROSCOPIC-ADD ON
BACTERIA UA: NONE SEEN
RBC / HPF: NONE SEEN RBC/hpf (ref 0–5)

## 2015-06-20 MED ORDER — METRONIDAZOLE 500 MG PO TABS: 500.0000 mg | ORAL_TABLET | Freq: Two times a day (BID) | ORAL | Status: DC

## 2015-06-20 MED ORDER — METRONIDAZOLE 0.75 % VA GEL: 1.0000 | Freq: Two times a day (BID) | VAGINAL | Status: DC

## 2015-06-20 NOTE — MAU Note (Addendum)
Patient presents with vaginal discharge with an odor onset x 1 week, no vaginal itching, white in color. LMP 06/06/2015

## 2015-06-20 NOTE — MAU Provider Note (Signed)
History     CSN: [id number]  Arrival date and time: 06/20/15 8295   First Provider Initiated Contact with Patient 06/20/15 1011      Chief Complaint  Patient presents with  . Vaginal Discharge   HPI   [name] is a 20 y.o. female G1P1001 presenting with vaginal discharge.  The Vaginal discharge started one week ago. The discharge is clear-white with an odor. It is a different odor than what she is used too.  She does attest to one new partner and would like all STD testing today.   She is on depo for birth control.    OB History    Gravida Para Term Preterm AB TAB SAB Ectopic Multiple Living   0 1      Past Medical History  Diagnosis Date  . Heart murmur   . Anxiety     no medications    Past Surgical History  Procedure Laterality Date  . No past surgeries      Family History  Problem Relation Age of Onset  . Hypertension Father   . Cancer Paternal Grandmother     Social History  Substance Use Topics  . Smoking status: Never Smoker   . Smokeless tobacco: Never Used  . Alcohol Use: No    Allergies: No Known Allergies  Prescriptions prior to admission  Medication Sig Dispense Refill Last Dose  . Multiple Vitamins-Minerals (WOMENS MULTI VITAMIN & MINERAL PO) Take 1 tablet by mouth daily.   06/19/2015 at Unknown time  . ibuprofen (ADVIL,MOTRIN) 800 MG tablet Take 1 tablet (800 mg total) by mouth 3 (three) times daily. (Patient not taking: Reported on 06/20/2015) 30 tablet 0 Not Taking at Unknown time   Results for orders placed or performed during the hospital encounter of 06/20/15 (from the past 48 hour(s))  Urinalysis, Routine w reflex microscopic (not at [facility])     Status: Abnormal   Collection Time: 06/20/15  9:40 AM  Result Value Ref Range   Color, Urine YELLOW YELLOW   APPearance CLEAR CLEAR   Specific Gravity, Urine 1.015 1.005 - 1.030   pH 5.5 5.0 - 8.0   Glucose, UA NEGATIVE NEGATIVE mg/dL   Hgb urine dipstick NEGATIVE NEGATIVE    Bilirubin Urine NEGATIVE NEGATIVE   Ketones, ur NEGATIVE NEGATIVE mg/dL   Protein, ur NEGATIVE NEGATIVE mg/dL   Nitrite NEGATIVE NEGATIVE   Leukocytes, UA SMALL (A) NEGATIVE  Urine microscopic-add on     Status: Abnormal   Collection Time: 06/20/15  9:40 AM  Result Value Ref Range   Squamous Epithelial / LPF 0-5 (A) NONE SEEN   WBC, UA 0-5 0 - 5 WBC/hpf   RBC / HPF NONE SEEN 0 - 5 RBC/hpf   Bacteria, UA NONE SEEN NONE SEEN  Pregnancy, urine POC     Status: None   Collection Time: 06/20/15  9:51 AM  Result Value Ref Range   Preg Test, Ur NEGATIVE NEGATIVE    Comment:        THE SENSITIVITY OF THIS METHODOLOGY IS >24 mIU/mL   Wet prep, genital     Status: Abnormal   Collection Time: 06/20/15 10:15 AM  Result Value Ref Range   Yeast Wet Prep HPF POC NONE SEEN NONE SEEN   Trich, Wet Prep NONE SEEN NONE SEEN   Clue Cells Wet Prep HPF POC PRESENT (A) NONE SEEN   WBC, Wet Prep HPF POC MODERATE (A) NONE SEEN  Comment: MODERATE BACTERIA SEEN   Sperm NONE SEEN     Review of Systems  Constitutional: Negative for fever and chills.  Gastrointestinal: Negative for abdominal pain.  Genitourinary: Negative for dysuria.   Physical Exam   Blood pressure 113/69, pulse 67, temperature 97.7 F (36.5 C), temperature source Oral, resp. rate 18, height 5\' 6"  (1.676 m), weight 123 lb 9.6 oz (56.065 kg), last menstrual period 06/06/2015, unknown if currently breastfeeding.  Physical Exam  Constitutional: She is oriented to person, place, and time. She appears well-developed and well-nourished. No distress.  HENT:  Head: Normocephalic.  Eyes: Pupils are equal, round, and reactive to light.  Genitourinary:  Wet prep and GC collected without speculum.   Musculoskeletal: Normal range of motion.  Neurological: She is alert and oriented to person, place, and time.  Skin: She is not diaphoretic.    MAU Course  Procedures  None  MDM  Wet prep GC HIV   Assessment and Plan    A:  1. Vaginal discharge   2. BV (bacterial vaginosis)     P:  Discharge home in stable condition  RX: Flagyl- no alcohol Return to MAU for emergencies Condoms always   Duane LopeJennifer I Jacoya Bauman, NP 06/20/2015 2:25 PM

## 2015-06-20 NOTE — Telephone Encounter (Signed)
Patient requesting metrogel as apposed to flagyl PO

## 2015-06-20 NOTE — Discharge Instructions (Signed)

## 2015-06-21 LAB — HIV ANTIBODY (ROUTINE TESTING W REFLEX): HIV SCREEN 4TH GENERATION: NONREACTIVE

## 2015-06-22 LAB — GC/CHLAMYDIA PROBE AMP (~~LOC~~) NOT AT ARMC
CHLAMYDIA, DNA PROBE: NEGATIVE
NEISSERIA GONORRHEA: NEGATIVE

## 2015-12-27 ENCOUNTER — Emergency Department (HOSPITAL_COMMUNITY)
Admission: EM | Admit: 2015-12-27 | Discharge: 2015-12-27 | Disposition: A | Discharge status: Home / Self Care | Payer: Medicaid Other | Attending: Emergency Medicine | Admitting: Emergency Medicine

## 2015-12-27 ENCOUNTER — Encounter (HOSPITAL_COMMUNITY): Payer: Self-pay

## 2015-12-27 DIAGNOSIS — Z79899 Other long term (current) drug therapy: Secondary | ICD-10-CM | POA: Diagnosis not present

## 2015-12-27 DIAGNOSIS — R42 Dizziness and giddiness: Secondary | ICD-10-CM | POA: Diagnosis present

## 2015-12-27 DIAGNOSIS — E860 Dehydration: Secondary | ICD-10-CM

## 2015-12-27 LAB — COMPREHENSIVE METABOLIC PANEL
ALBUMIN: 5.1 g/dL — AB (ref 3.5–5.0)
ALK PHOS: 98 U/L (ref 38–126)
ALT: 12 U/L — AB (ref 14–54)
AST: 22 U/L (ref 15–41)
Anion gap: 9 (ref 5–15)
BILIRUBIN TOTAL: 0.7 mg/dL (ref 0.3–1.2)
BUN: 15 mg/dL (ref 6–20)
CALCIUM: 9.9 mg/dL (ref 8.9–10.3)
CO2: 25 mmol/L (ref 22–32)
CREATININE: 0.81 mg/dL (ref 0.44–1.00)
Chloride: 103 mmol/L (ref 101–111)
GFR calc Af Amer: >60 mL/min (ref >60)
GFR calc non Af Amer: >60 mL/min (ref >60)
GLUCOSE: 102 mg/dL — AB (ref 65–99)
POTASSIUM: 3.7 mmol/L (ref 3.5–5.1)
Sodium: 137 mmol/L (ref 135–145)
TOTAL PROTEIN: 9.2 g/dL — AB (ref 6.5–8.1)

## 2015-12-27 LAB — URINALYSIS, ROUTINE W REFLEX MICROSCOPIC
Bilirubin Urine: NEGATIVE
Glucose, UA: NEGATIVE mg/dL
Hgb urine dipstick: NEGATIVE
Ketones, ur: NEGATIVE mg/dL
Leukocytes, UA: NEGATIVE
Nitrite: NEGATIVE
Protein, ur: NEGATIVE mg/dL
Specific Gravity, Urine: 1.027 (ref 1.005–1.030)
pH: 6.5 (ref 5.0–8.0)

## 2015-12-27 LAB — CBC
HCT: 40.3 % (ref 36.0–46.0)
Hemoglobin: 13.5 g/dL (ref 12.0–15.0)
MCH: 24.3 pg — ABNORMAL LOW (ref 26.0–34.0)
MCHC: 33.5 g/dL (ref 30.0–36.0)
MCV: 72.6 fL — ABNORMAL LOW (ref 78.0–100.0)
Platelets: 235 K/uL (ref 150–400)
RBC: 5.55 MIL/uL — ABNORMAL HIGH (ref 3.87–5.11)
RDW: 15 % (ref 11.5–15.5)
WBC: 6.7 K/uL (ref 4.0–10.5)

## 2015-12-27 LAB — I-STAT BETA HCG BLOOD, ED (MC, WL, AP ONLY): I-stat hCG, quantitative: <5 m[IU]/mL (ref <5)

## 2015-12-27 LAB — LIPASE, BLOOD: Lipase: 30 U/L (ref 11–51)

## 2015-12-27 MED ORDER — SODIUM CHLORIDE 0.9 % IV BOLUS (SEPSIS)
1000.0000 mL | Freq: Once | INTRAVENOUS | Status: AC
  Administered 2015-12-27: 1000 mL via INTRAVENOUS

## 2015-12-27 NOTE — Discharge Instructions (Signed)
Get plenty of rest, and drink a lot of fluids.  Follow-up with a primary care doctor, if needed, for problems.

## 2015-12-27 NOTE — ED Triage Notes (Signed)
Pt c/o nausea, abdominal pain, and dizziness x 2 days.  Pain score 5/10.  Pt reports symptoms started after syncopal episode following lab work x 2 days ago.  Pt reports that she is breastfeeding and is concerned that it is "draining" her.  Sts "they told me that my blood pressure was low."

## 2015-12-27 NOTE — ED Notes (Signed)
Discharge instructions and follow up care reviewed with patient. Patient verbalized understanding. 

## 2015-12-27 NOTE — ED Provider Notes (Signed)
[facility]-EMERGENCY DEPT Provider Note   CSN: [id number] Arrival date & time: 12/27/15  1439     History   Chief Complaint Chief Complaint  Patient presents with  . Nausea  . Dizziness    HPI Angela Acosta is a 20 y.o. female.  She presents for evaluation of a sensation of dehydration, associated with weakness for 2 days. She states she had routine blood work drawn 2 days ago after which she had a fainting episode. Subsequent to that she vomited twice. She has been eating well. She is breast-feeding, and feels fatigued from that. Her child is over 27-year-old. She does not think that she is pregnant currently. She denies fever, chills, cough, chest pain, focal weakness or paresthesias. There are no other known modifying factors.  HPI  Past Medical History:  Diagnosis Date  . Anxiety    no medications  . Heart murmur     Patient Active Problem List   Diagnosis Date Noted  . Active labor at term 11/19/2014  . SROM early labor 11/19/2014  . Postpartum state 11/19/2014    Past Surgical History:  Procedure Laterality Date  . NO PAST SURGERIES      OB History    Gravida Para Term Preterm AB Living   1 1 1     1    SAB TAB Ectopic Multiple Live Births         0 1       Home Medications    Prior to Admission medications   Medication Sig Start Date End Date Taking? Authorizing Provider  medroxyPROGESTERone (DEPO-PROVERA) 150 MG/ML injection Inject 1 mg into the muscle every 3 (three) months. 05/15/15   Historical Provider, MD  metroNIDAZOLE (FLAGYL) 500 MG tablet Take 1 tablet (500 mg total) by mouth 2 (two) times daily. 06/20/15   Harolyn Rutherford Rasch, NP  metroNIDAZOLE (METROGEL VAGINAL) 0.75 % vaginal gel Place 1 Applicatorful vaginally 2 (two) times daily. 06/20/15   Duane Lope, NP  Multiple Vitamins-Minerals (WOMENS MULTI VITAMIN & MINERAL PO) Take 1 tablet by mouth daily.    Historical Provider, MD    Family History Family History  Problem Relation Age of Onset  .  Hypertension Father   . Cancer Paternal Grandmother     Social History Social History  Substance Use Topics  . Smoking status: Never Smoker  . Smokeless tobacco: Never Used  . Alcohol use No     Allergies   Review of patient's allergies indicates no known allergies.   Review of Systems Review of Systems  All other systems reviewed and are negative.    Physical Exam Updated Vital Signs BP 141/74 (BP Location: Right Arm)   Pulse 68   Temp 98.6 F (37 C) (Oral)   Resp 16   Ht 5' 6.75" (1.695 m)   Wt 122 lb (55.3 kg)   SpO2 100%   Breastfeeding? Yes   BMI 19.25 kg/m   Physical Exam  Constitutional: She is oriented to person, place, and time. She appears well-developed.  Slender but well proportioned.  HENT:  Head: Normocephalic and atraumatic.  Eyes: Conjunctivae and EOM are normal. Pupils are equal, round, and reactive to light.  Neck: Normal range of motion and phonation normal. Neck supple.  Cardiovascular: Normal rate and regular rhythm.   Pulmonary/Chest: Effort normal and breath sounds normal. She exhibits no tenderness.  Abdominal: Soft. She exhibits no distension. There is no tenderness. There is no guarding.  Musculoskeletal: Normal range of motion.  Neurological:  She is alert and oriented to person, place, and time. She exhibits normal muscle tone.  Skin: Skin is warm and dry.  Psychiatric: She has a normal mood and affect. Her behavior is normal. Judgment and thought content normal.  Nursing note and vitals reviewed.    ED Treatments / Results  Labs (all labs ordered are listed, but only abnormal results are displayed) Labs Reviewed  LIPASE, BLOOD  COMPREHENSIVE METABOLIC PANEL  CBC  URINALYSIS, ROUTINE W REFLEX MICROSCOPIC (NOT AT [facility])  I-STAT BETA HCG BLOOD, ED ([facility], [facility], [facility] ONLY)    EKG  EKG Interpretation None       Radiology No results found.  Procedures Procedures (including critical care time)  Medications Ordered in  ED Medications - No data to display   Initial Impression / Assessment and Plan / ED Course  I have reviewed the triage vital signs and the nursing notes.  Pertinent labs & imaging results that were available during my care of the patient were reviewed by me and considered in my medical decision making (see chart for details).  Clinical Course    Medications - No data to display  Patient Vitals for the past 24 hrs:  BP Temp Temp src Pulse Resp SpO2 Height Weight  12/27/15 1452 - - - - - - 5' 6.75" (1.695 m) 122 lb (55.3 kg)  12/27/15 1451 141/74 98.6 F (37 C) Oral 68 16 100 % - -    At D/C Reevaluation with update and discussion. After initial assessment and treatment, an updated evaluation reveals she is more comfortable and wants to go home.Mancel Bale. Erlinda Solinger L    Final Clinical Impressions(s) / ED Diagnoses   Final diagnoses:  Dehydration    Nonspecific malaise and mild dehydration. Doubt SBI or metabolic instability.  Nursing Notes Reviewed/ Care Coordinated Applicable Imaging Reviewed Interpretation of Laboratory Data incorporated into ED treatment  The patient appears reasonably screened and/or stabilized for discharge and I doubt any other medical condition or other [facility] requiring further screening, evaluation, or treatment in the ED at this time prior to discharge.  Plan: Home Medications- continue; Home Treatments- rest; return here if the recommended treatment, does not improve the symptoms; Recommended follow up- PCP prn  New Prescriptions New Prescriptions   No medications on file     Mancel BaleElliott Ramonda Galyon, MD 12/28/15 1954

## 2016-02-08 ENCOUNTER — Encounter: Payer: Self-pay | Admitting: Family Medicine

## 2016-02-08 ENCOUNTER — Ambulatory Visit (INDEPENDENT_AMBULATORY_CARE_PROVIDER_SITE_OTHER): Payer: Medicaid Other | Admitting: Family Medicine

## 2016-02-08 VITALS — BP 114/72 | HR 74 | Temp 98.7°F | Resp 16 | Ht 66.75 in | Wt 127.0 lb

## 2016-02-08 DIAGNOSIS — Z7689 Persons encountering health services in other specified circumstances: Secondary | ICD-10-CM | POA: Diagnosis not present

## 2016-02-08 DIAGNOSIS — Z23 Encounter for immunization: Secondary | ICD-10-CM | POA: Diagnosis not present

## 2016-02-08 DIAGNOSIS — Z131 Encounter for screening for diabetes mellitus: Secondary | ICD-10-CM

## 2016-02-09 NOTE — Progress Notes (Signed)
Angela Acosta, is a 20 y.o. female  ZOX:[id number]  WJX:[id number]  DOB - 28-May-1995  CC:  Chief Complaint  Patient presents with  . Establish Care  . Knee Pain    both knee pain more in the left (sharp pains)   . Contraception    wants to discuss options        HPI: Angela Acosta is a 20 y.o. female here to establish care. She has a diagnosis of anxiety and a history of a heart murmur. He is on no regular medication. She was recently on Depo-Provera for birth control. She has recently stopped this and is not sure what method she would like to use. Her other complaint today is of bilateral knee pain. She is aware of no injury. She reports they pop and crack.  She is breast feeding her one year-old child. She does admit to be moody and having some OCD tendencies. She is feeling over-protective of her baby.   Health Maintenance:  She reports her PAP is up to date, as well as Tdap. She agrees to a flu shot today.  BEFORE WE WERE ABLE TO FOLLOW-UP AND GIVE FLU SHOT AND DRAW ROUTINE BLOOD, SHE LEFT WITHOUT CHECKING OUT.  No Known Allergies Past Medical History:  Diagnosis Date  . Anxiety    no medications  . Heart murmur    Current Outpatient Prescriptions on File Prior to Visit  Medication Sig Dispense Refill  . Multiple Vitamins-Minerals (WOMENS MULTI VITAMIN & MINERAL PO) Take 1 tablet by mouth daily.     No current facility-administered medications on file prior to visit.    Family History  Problem Relation Age of Onset  . Hypertension Father   . Cancer Paternal Grandmother    Social History   Social History  . Marital status: Single    Spouse name: N/A  . Number of children: N/A  . Years of education: N/A   Occupational History  . Not on file.   Social History Main Topics  . Smoking status: Never Smoker  . Smokeless tobacco: Never Used  . Alcohol use Yes     Comment: rare  . Drug use: No  . Sexual activity: Yes    Birth control/ protection: None   Other  Topics Concern  . Not on file   Social History Narrative  . No narrative on file    Review of Systems: Constitutional: Negative Skin: Negative HENT: Negative  Eyes: Negative  Neck: Negative Respiratory: Negative Cardiovascular: Negative Gastrointestinal: Negative Genitourinary: Negative  Musculoskeletal: Negative   Neurological: Negative for Hematological: Negative  Psychiatric/Behavioral: Negative    Objective:   Vitals:   02/08/16 1438  BP: 114/72  Pulse: 74  Resp: 16  Temp: 98.7 F (37.1 C)    Physical Exam: Constitutional: Patient appears well-developed and well-nourished. No distress. HENT: Normocephalic, atraumatic, External right and left ear normal. Oropharynx is clear and moist.  Eyes: Conjunctivae and EOM are normal. PERRLA, no scleral icterus. Neck: Normal ROM. Neck supple. No lymphadenopathy, No thyromegaly. CVS: RRR, S1/S2 +, no murmurs, no gallops, no rubs Pulmonary: Effort and breath sounds normal, no stridor, rhonchi, wheezes, rales.  Abdominal: Soft. Normoactive BS,, no distension, tenderness, rebound or guarding.  Musculoskeletal: Normal range of motion. No edema and no tenderness.  Neuro: Alert.Normal muscle tone coordination. Non-focal Skin: Skin is warm and dry. No rash noted. Not diaphoretic. No erythema. No pallor. Psychiatric: Normal mood and affect. Behavior, judgment, thought content normal.  Lab Results  Component Value  Date   WBC 6.7 12/27/2015   HGB 13.5 12/27/2015   HCT 40.3 12/27/2015   MCV 72.6 (L) 12/27/2015   PLT 235 12/27/2015   Lab Results  Component Value Date   CREATININE 0.81 12/27/2015   BUN 15 12/27/2015   NA 137 12/27/2015   K 3.7 12/27/2015   CL 103 12/27/2015   CO2 25 12/27/2015    No results found for: HGBA1C Lipid Panel  No results found for: CHOL, TRIG, HDL, CHOLHDL, VLDL, LDLCALC     Assessment and plan:     PATIENT LEFT BEFORE PLAN COULD BE CARRIED OUT. No Follow-up on file.  The patient was  given clear instructions to go to ER or return to medical center if symptoms don't improve, worsen or new problems develop. The patient verbalized understanding.    Henrietta HooverLinda C Hanako Tipping FNP  02/09/2016, 1:16 PM

## 2016-02-11 ENCOUNTER — Telehealth: Payer: Self-pay | Admitting: Hematology

## 2016-02-11 NOTE — Telephone Encounter (Signed)
Phone call made to patient to follow up to office visit on 02/08/2016.  Per office note and staff, patient left prior to the completion of the appointment in a hurried manner. / I called patient to make sure everything was ok.  Patient stated the service was great, but she had her 21 year old with her.  She went out to get her cell phone and locked her keys in the car, and then had to wait for family members.  Patient stated that stressed her out so she did not come back inside to complete her appointment. / Patient voiced that she would like to reschedule the appointment. / Secretary notified.

## 2016-04-25 ENCOUNTER — Inpatient Hospital Stay (HOSPITAL_COMMUNITY)
Admission: AD | Admit: 2016-04-25 | Discharge: 2016-04-25 | Discharge status: Against Medical Advice | Payer: Medicaid Other | Source: Ambulatory Visit | Attending: Obstetrics and Gynecology | Admitting: Obstetrics and Gynecology

## 2016-04-25 ENCOUNTER — Encounter (HOSPITAL_COMMUNITY): Payer: Self-pay | Admitting: *Deleted

## 2016-04-25 DIAGNOSIS — R252 Cramp and spasm: Secondary | ICD-10-CM | POA: Diagnosis present

## 2016-04-25 LAB — URINALYSIS, ROUTINE W REFLEX MICROSCOPIC
BILIRUBIN URINE: NEGATIVE
Glucose, UA: NEGATIVE mg/dL
HGB URINE DIPSTICK: NEGATIVE
KETONES UR: NEGATIVE mg/dL
Leukocytes, UA: NEGATIVE
NITRITE: NEGATIVE
Protein, ur: NEGATIVE mg/dL
Specific Gravity, Urine: 1.015 (ref 1.005–1.030)
pH: 8 (ref 5.0–8.0)

## 2016-04-25 LAB — POCT PREGNANCY, URINE: Preg Test, Ur: NEGATIVE

## 2016-04-25 NOTE — MAU Note (Signed)
Pt called, not in lobby.  Did not inform staff she was leaving. 

## 2016-04-25 NOTE — MAU Note (Signed)
Pt C/O severe lower abd cramping, hasn't had a period in 2 years - was on depo for 3 months, stopped the depo, hasn't had a period since.  Had slight spotting yesterday, was a "slimy substance."  Had scant amount of blood with wiping today.

## 2016-04-25 NOTE — MAU Note (Signed)
Pt called to take to MAU satellite, not in lobby.

## 2016-04-25 NOTE — MAU Note (Signed)
Pt called, not in lobby 

## 2016-04-29 ENCOUNTER — Ambulatory Visit: Payer: Medicaid Other | Admitting: Family Medicine

## 2016-05-16 ENCOUNTER — Inpatient Hospital Stay (HOSPITAL_COMMUNITY)
Admission: AD | Admit: 2016-05-16 | Discharge: 2016-05-16 | Disposition: A | Discharge status: Home / Self Care | Payer: Medicaid Other | Source: Ambulatory Visit | Attending: Family Medicine | Admitting: Family Medicine

## 2016-05-16 DIAGNOSIS — N730 Acute parametritis and pelvic cellulitis: Secondary | ICD-10-CM | POA: Diagnosis not present

## 2016-05-16 DIAGNOSIS — N760 Acute vaginitis: Secondary | ICD-10-CM

## 2016-05-16 DIAGNOSIS — B9689 Other specified bacterial agents as the cause of diseases classified elsewhere: Secondary | ICD-10-CM

## 2016-05-16 DIAGNOSIS — N739 Female pelvic inflammatory disease, unspecified: Secondary | ICD-10-CM | POA: Diagnosis not present

## 2016-05-16 DIAGNOSIS — R791 Abnormal coagulation profile: Secondary | ICD-10-CM | POA: Diagnosis present

## 2016-05-16 LAB — WET PREP, GENITAL
Sperm: NONE SEEN
Trich, Wet Prep: NONE SEEN
YEAST WET PREP: NONE SEEN

## 2016-05-16 LAB — POCT PREGNANCY, URINE: PREG TEST UR: NEGATIVE

## 2016-05-16 MED ORDER — FLUCONAZOLE 150 MG PO TABS: 150.0000 mg | ORAL_TABLET | Freq: Once | ORAL | 1 refills | Status: AC

## 2016-05-16 MED ORDER — SODIUM CHLORIDE 0.9 % IV SOLN: INTRAVENOUS | Status: DC

## 2016-05-16 MED ORDER — DOXYCYCLINE HYCLATE 100 MG PO CAPS: 100.0000 mg | ORAL_CAPSULE | Freq: Two times a day (BID) | ORAL | 0 refills | Status: AC

## 2016-05-16 MED ORDER — MORPHINE BOLUS VIA INFUSION: 4.0000 mg | Freq: Once | INTRAVENOUS | Status: DC

## 2016-05-16 MED ORDER — METRONIDAZOLE 500 MG PO TABS: 500.0000 mg | ORAL_TABLET | Freq: Two times a day (BID) | ORAL | 0 refills | Status: AC

## 2016-05-16 MED ORDER — CEFTRIAXONE SODIUM 250 MG IJ SOLR
250.0000 mg | Freq: Once | INTRAMUSCULAR | Status: AC
  Administered 2016-05-16: 250 mg via INTRAMUSCULAR
  Filled 2016-05-16: qty 250

## 2016-05-16 NOTE — MAU Provider Note (Signed)
  History     CSN: [id number]  Arrival date and time: 05/16/16 1811   None     No chief complaint on file.  Patient is a 21 y/o F who presents with 1-2 months of continuous spotting. She reports that she had depo for several moths after her last child but stopped almost a year ago and just started her period a few months ago. She reports that she had a normal period that started almost 2 months ago, but could not tell me a exact date. She reports she has had on going bleeding since then. She had cramps at the beginning that resolved that have now worsened.  She had severe abdominal pain yesterday that made her nauseous. She is sexually active with one partner and does not use protections.    Past Medical History:  Diagnosis Date  . Anxiety    no medications  . Heart murmur     Past Surgical History:  Procedure Laterality Date  . NO PAST SURGERIES      Family History  Problem Relation Age of Onset  . Hypertension Father   . Cancer Paternal Grandmother     Social History  Substance Use Topics  . Smoking status: Never Smoker  . Smokeless tobacco: Never Used  . Alcohol use Yes     Comment: rare    Allergies: No Known Allergies  Prescriptions Prior to Admission  Medication Sig Dispense Refill Last Dose  . Multiple Vitamins-Minerals (WOMENS MULTI VITAMIN & MINERAL PO) Take 1 tablet by mouth daily.   Not Taking    Review of Systems  Constitutional: Negative for chills and fever.  HENT: Negative for congestion and rhinorrhea.   Respiratory: Negative for cough and shortness of breath.   Cardiovascular: Negative for palpitations.  Gastrointestinal: Positive for abdominal pain. Negative for abdominal distention, constipation, diarrhea and nausea.  Genitourinary: Negative for difficulty urinating, dysuria, flank pain and hematuria.  Musculoskeletal: Negative for back pain and myalgias.  Neurological: Negative for dizziness and headaches.   Physical Exam   currently  breastfeeding.  Physical Exam  Constitutional: She is oriented to person, place, and time. She appears well-developed and well-nourished.  HENT:  Head: Normocephalic and atraumatic.  Cardiovascular: Normal rate and intact distal pulses.   Respiratory: Effort normal. No respiratory distress.  GI: Soft. Bowel sounds are normal. She exhibits no distension. There is tenderness. There is no rebound and no guarding.  Genitourinary:  Genitourinary Comments: significant cervical motion tenderness, no significant vaginal discharge or bleeding noted. Health pink mucosa.  Neurological: She is alert and oriented to person, place, and time.  Skin: Skin is warm and dry.  Psychiatric: She has a normal mood and affect. Her behavior is normal.    MAU Course  Procedures  MDM In [place] patient under went wet prep and GC/CT testing Wet prep revealed BV Patient had cervical motion tenderness Preg was negative  Assessment and Plan  PID: given cervical motion tenderness and prolonged bleeding plan for treatment for PID. Given 250mg  of rocephin IM. Plan for doxycycline 100mg  BID x14 days BV: start flagyl 500mg  BID x 7 days I did give patient a prescription for diflucan in case she gets yeast infection with all the antibiotics.   Ernestina Pennaicholas Tawsha Terrero 05/16/2016, 6:51 PM

## 2016-05-16 NOTE — Discharge Instructions (Signed)
Pelvic Inflammatory Disease °Introduction °Pelvic inflammatory disease (PID) is an infection in some or all of the female organs. PID can be in the uterus, ovaries, fallopian tubes, or the surrounding tissues that are inside the lower belly area (pelvis). PID can lead to lasting problems if it is not treated. To check for this disease, your doctor may: °· Do a physical exam. °· Do blood tests, urine tests, or a pregnancy test. °· Look at your vaginal discharge. °· Do tests to look inside the pelvis. °· Test you for other infections. °Follow these instructions at home: °· Take over-the-counter and prescription medicines only as told by your doctor. °· If you were prescribed an antibiotic medicine, take it as told by your doctor. Do not stop taking it even if you start to feel better. °· Do not have sex until treatment is done or as told by your doctor. °· Tell your sex partner if you have PID. Your partner may need to be treated. °· Keep all follow-up visits as told by your doctor. This is important. °· Your doctor may test you for infection again 3 months after you are treated. °Contact a doctor if: °· You have more fluid (discharge) coming from your vagina or fluid that is not normal. °· Your pain does not improve. °· You throw up (vomit). °· You have a fever. °· You cannot take your medicines. °· Your partner has a sexually transmitted disease (STD). °· You have pain when you pee (urinate). °Get help right away if: °· You have more belly (abdominal) or lower belly pain. °· You have chills. °· You are not better after 72 hours. °This information is not intended to replace advice given to you by your health care provider. Make sure you discuss any questions you have with your health care provider. °Document Released: 07/01/2008 Document Revised: 09/10/2015 Document Reviewed: 05/12/2014 °© 2017 Elsevier ° °

## 2016-05-17 ENCOUNTER — Telehealth (HOSPITAL_COMMUNITY): Payer: Self-pay

## 2016-05-17 LAB — GC/CHLAMYDIA PROBE AMP (~~LOC~~) NOT AT ARMC
Chlamydia: NEGATIVE
Neisseria Gonorrhea: NEGATIVE

## 2016-06-20 ENCOUNTER — Encounter (HOSPITAL_COMMUNITY): Payer: Self-pay | Admitting: Emergency Medicine

## 2016-06-20 ENCOUNTER — Emergency Department (HOSPITAL_COMMUNITY)
Admission: EM | Admit: 2016-06-20 | Discharge: 2016-06-20 | Disposition: A | Discharge status: Home / Self Care | Payer: Medicaid Other | Attending: Emergency Medicine | Admitting: Emergency Medicine

## 2016-06-20 DIAGNOSIS — Z5321 Procedure and treatment not carried out due to patient leaving prior to being seen by health care provider: Secondary | ICD-10-CM | POA: Insufficient documentation

## 2016-06-20 DIAGNOSIS — R1030 Lower abdominal pain, unspecified: Secondary | ICD-10-CM | POA: Insufficient documentation

## 2016-06-20 LAB — COMPREHENSIVE METABOLIC PANEL
ALBUMIN: 4.4 g/dL (ref 3.5–5.0)
ALT: 9 U/L — AB (ref 14–54)
AST: 16 U/L (ref 15–41)
Alkaline Phosphatase: 66 U/L (ref 38–126)
Anion gap: 6 (ref 5–15)
BUN: 14 mg/dL (ref 6–20)
CHLORIDE: 104 mmol/L (ref 101–111)
CO2: 26 mmol/L (ref 22–32)
CREATININE: 0.76 mg/dL (ref 0.44–1.00)
Calcium: 9.3 mg/dL (ref 8.9–10.3)
GFR calc Af Amer: >60 mL/min (ref >60)
GFR calc non Af Amer: >60 mL/min (ref >60)
GLUCOSE: 93 mg/dL (ref 65–99)
POTASSIUM: 3.9 mmol/L (ref 3.5–5.1)
Sodium: 136 mmol/L (ref 135–145)
Total Bilirubin: 0.6 mg/dL (ref 0.3–1.2)
Total Protein: 7.8 g/dL (ref 6.5–8.1)

## 2016-06-20 LAB — CBC
HEMATOCRIT: 36.8 % (ref 36.0–46.0)
Hemoglobin: 12.3 g/dL (ref 12.0–15.0)
MCH: 24.2 pg — AB (ref 26.0–34.0)
MCHC: 33.4 g/dL (ref 30.0–36.0)
MCV: 72.4 fL — AB (ref 78.0–100.0)
Platelets: 264 10*3/uL (ref 150–400)
RBC: 5.08 MIL/uL (ref 3.87–5.11)
RDW: 15.1 % (ref 11.5–15.5)
WBC: 7.5 10*3/uL (ref 4.0–10.5)

## 2016-06-20 LAB — LIPASE, BLOOD: Lipase: 27 U/L (ref 11–51)

## 2016-06-20 NOTE — ED Triage Notes (Signed)
Patient c/o lower abdominal pain x2 weeks. Reports nausea but denies V/D, urinary sx, discharge, and vaginal bleeding. Ambulatory to triage.

## 2016-06-20 NOTE — ED Notes (Signed)
Called for second time  No response  

## 2016-06-20 NOTE — ED Notes (Signed)
Called to take to treatment room  No response from lobby 

## 2016-06-21 NOTE — Telephone Encounter (Signed)
See "Contacts" 

## 2016-09-05 ENCOUNTER — Inpatient Hospital Stay (HOSPITAL_COMMUNITY)
Admission: AD | Admit: 2016-09-05 | Discharge: 2016-09-05 | Disposition: A | Discharge status: Home / Self Care | Payer: Medicaid Other | Source: Ambulatory Visit | Attending: Obstetrics & Gynecology | Admitting: Obstetrics & Gynecology

## 2016-09-05 ENCOUNTER — Encounter (HOSPITAL_COMMUNITY): Payer: Self-pay | Admitting: *Deleted

## 2016-09-05 DIAGNOSIS — N760 Acute vaginitis: Secondary | ICD-10-CM | POA: Insufficient documentation

## 2016-09-05 DIAGNOSIS — N911 Secondary amenorrhea: Secondary | ICD-10-CM | POA: Insufficient documentation

## 2016-09-05 DIAGNOSIS — R102 Pelvic and perineal pain: Secondary | ICD-10-CM | POA: Diagnosis not present

## 2016-09-05 DIAGNOSIS — F419 Anxiety disorder, unspecified: Secondary | ICD-10-CM | POA: Diagnosis not present

## 2016-09-05 DIAGNOSIS — N898 Other specified noninflammatory disorders of vagina: Secondary | ICD-10-CM | POA: Diagnosis present

## 2016-09-05 DIAGNOSIS — R011 Cardiac murmur, unspecified: Secondary | ICD-10-CM | POA: Diagnosis not present

## 2016-09-05 DIAGNOSIS — B9689 Other specified bacterial agents as the cause of diseases classified elsewhere: Secondary | ICD-10-CM | POA: Diagnosis not present

## 2016-09-05 DIAGNOSIS — R300 Dysuria: Secondary | ICD-10-CM | POA: Insufficient documentation

## 2016-09-05 LAB — WET PREP, GENITAL
Sperm: NONE SEEN
Trich, Wet Prep: NONE SEEN
YEAST WET PREP: NONE SEEN

## 2016-09-05 LAB — CBC
HCT: 35.6 % — ABNORMAL LOW (ref 36.0–46.0)
HEMOGLOBIN: 11.8 g/dL — AB (ref 12.0–15.0)
MCH: 24.6 pg — ABNORMAL LOW (ref 26.0–34.0)
MCHC: 33.1 g/dL (ref 30.0–36.0)
MCV: 74.2 fL — AB (ref 78.0–100.0)
PLATELETS: 231 10*3/uL (ref 150–400)
RBC: 4.8 MIL/uL (ref 3.87–5.11)
RDW: 15.3 % (ref 11.5–15.5)
WBC: 6.5 10*3/uL (ref 4.0–10.5)

## 2016-09-05 LAB — URINALYSIS, ROUTINE W REFLEX MICROSCOPIC
Bilirubin Urine: NEGATIVE
Glucose, UA: NEGATIVE mg/dL
HGB URINE DIPSTICK: NEGATIVE
Ketones, ur: NEGATIVE mg/dL
Leukocytes, UA: NEGATIVE
NITRITE: NEGATIVE
PROTEIN: NEGATIVE mg/dL
SPECIFIC GRAVITY, URINE: 1.015 (ref 1.005–1.030)
pH: 7 (ref 5.0–8.0)

## 2016-09-05 LAB — POCT PREGNANCY, URINE: PREG TEST UR: NEGATIVE

## 2016-09-05 MED ORDER — METRONIDAZOLE 500 MG PO TABS: 500.0000 mg | ORAL_TABLET | Freq: Two times a day (BID) | ORAL | 0 refills | Status: DC

## 2016-09-05 MED ORDER — METRONIDAZOLE 0.75 % VA GEL: 1.0000 | Freq: Every day | VAGINAL | 1 refills | Status: DC

## 2016-09-05 NOTE — MAU Provider Note (Signed)
Chief Complaint: Vaginal Discharge   First Provider Initiated Contact with Patient 09/05/16 2100      SUBJECTIVE HPI: Angela Acosta is a 21 y.o. G1P1001 who presents to maternity admissions reporting vaginal discharge with odor and pain with intercourse and mild dysuria x 1 week.  She also reports late menses, with lmp 4/16.  Her period are usually regular.  She has not tried any treatments. She denies any associated symptoms. She desires STD testing today. She denies vaginal bleeding, vaginal itching/burning, h/a, dizziness, n/v, or fever/chills.     HPI  Past Medical History:  Diagnosis Date  . Anxiety    no medications  . Heart murmur    Past Surgical History:  Procedure Laterality Date  . NO PAST SURGERIES     Social History   Social History  . Marital status: Single    Spouse name: N/A  . Number of children: N/A  . Years of education: N/A   Occupational History  . Not on file.   Social History Main Topics  . Smoking status: Never Smoker  . Smokeless tobacco: Never Used  . Alcohol use Yes     Comment: rare  . Drug use: No  . Sexual activity: Yes    Birth control/ protection: None   Other Topics Concern  . Not on file   Social History Narrative  . No narrative on file   No current facility-administered medications on file prior to encounter.    No current outpatient prescriptions on file prior to encounter.   No Known Allergies  ROS:  Review of Systems  Constitutional: Negative for chills, fatigue and fever.  Respiratory: Negative for shortness of breath.   Cardiovascular: Negative for chest pain.  Gastrointestinal: Negative for constipation, diarrhea, nausea and vomiting.  Genitourinary: Positive for pelvic pain and vaginal discharge. Negative for difficulty urinating, dysuria, flank pain, vaginal bleeding and vaginal pain.  Neurological: Negative for dizziness and headaches.  Psychiatric/Behavioral: Negative.      I have reviewed patient's Past  Medical Hx, Surgical Hx, Family Hx, Social Hx, medications and allergies.   Physical Exam   Patient Vitals for the past 24 hrs:  BP Temp Temp src Pulse Resp SpO2  09/05/16 2210 116/75 - - 74 - -  09/05/16 2011 115/61 98.4 F (36.9 C) Oral 70 16 100 %   Constitutional: Well-developed, well-nourished female in no acute distress.  Cardiovascular: normal rate Respiratory: normal effort GI: Abd soft, non-tender. Pos BS x 4 MS: Extremities nontender, no edema, normal ROM Neurologic: Alert and oriented x 4.  GU: Neg CVAT.  PELVIC EXAM: Cervix visually closed, mild erythema at os, scant white creamy discharge, vaginal walls and external genitalia normal Bimanual exam: Cervix 0/long/high, firm, anterior, neg CMT, uterus nontender, nonenlarged, adnexa without tenderness, enlargement, or mass   LAB RESULTS Results for orders placed or performed during the hospital encounter of 09/05/16 (from the past 24 hour(s))  Urinalysis, Routine w reflex microscopic     Status: None   Collection Time: 09/05/16  8:12 PM  Result Value Ref Range   Color, Urine YELLOW YELLOW   APPearance CLEAR CLEAR   Specific Gravity, Urine 1.015 1.005 - 1.030   pH 7.0 5.0 - 8.0   Glucose, UA NEGATIVE NEGATIVE mg/dL   Hgb urine dipstick NEGATIVE NEGATIVE   Bilirubin Urine NEGATIVE NEGATIVE   Ketones, ur NEGATIVE NEGATIVE mg/dL   Protein, ur NEGATIVE NEGATIVE mg/dL   Nitrite NEGATIVE NEGATIVE   Leukocytes, UA NEGATIVE NEGATIVE  Pregnancy,  urine POC     Status: None   Collection Time: 09/05/16  8:42 PM  Result Value Ref Range   Preg Test, Ur NEGATIVE NEGATIVE  Wet prep, genital     Status: Abnormal   Collection Time: 09/05/16  8:56 PM  Result Value Ref Range   Yeast Wet Prep HPF POC NONE SEEN NONE SEEN   Trich, Wet Prep NONE SEEN NONE SEEN   Clue Cells Wet Prep HPF POC PRESENT (A) NONE SEEN   WBC, Wet Prep HPF POC MODERATE (A) NONE SEEN   Sperm NONE SEEN   CBC     Status: Abnormal   Collection Time: 09/05/16   9:07 PM  Result Value Ref Range   WBC 6.5 4.0 - 10.5 K/uL   RBC 4.80 3.87 - 5.11 MIL/uL   Hemoglobin 11.8 (L) 12.0 - 15.0 g/dL   HCT 13.2 (L) 44.0 - 10.2 %   MCV 74.2 (L) 78.0 - 100.0 fL   MCH 24.6 (L) 26.0 - 34.0 pg   MCHC 33.1 30.0 - 36.0 g/dL   RDW 72.5 36.6 - 44.0 %   Platelets 231 150 - 400 K/uL       IMAGING No results found.  MAU Management/MDM: Ordered labs (U/A, CBC, wet prep)and reviewed results.  GC, RPR, HIV pending Will treat for BV with Metrogel Q hs x 5 nights Pt to f/u in office for routine gyn care, to MAU for emergencies Take pregnancy test in 1 week if still late menses  Pt stable at time of discharge.  ASSESSMENT 1. Bacterial vaginosis   2. Acute pelvic pain, female   3. Secondary amenorrhea     PLAN Discharge home Allergies as of 09/05/2016   No Known Allergies     Medication List    TAKE these medications   metroNIDAZOLE 0.75 % vaginal gel Commonly known as:  METROGEL Place 1 Applicatorful vaginally at bedtime. Apply one applicatorful to vagina at bedtime for 5 days      Follow-up Information    [facility].   Specialty:  Obstetrics and Gynecology Why:  Or Gyn provider of your choice for routine care. Return to MAU as needed for emergencies. Contact information: [address] [place] [address] [phone]          Sharen Counter Certified Nurse-Midwife 09/05/2016  10:26 PM

## 2016-09-05 NOTE — MAU Note (Signed)
Pt reports a lot of vaginal discharge with odor and "a lot of cervical pain." States that she has had this for 1 week. Had some spotting earlier in the week but none now.  LMP: 08/01/2016. Had a negative upt at home yesterday.

## 2016-09-06 LAB — HIV ANTIBODY (ROUTINE TESTING W REFLEX): HIV Screen 4th Generation wRfx: NONREACTIVE

## 2016-09-06 LAB — GC/CHLAMYDIA PROBE AMP (~~LOC~~) NOT AT ARMC
Chlamydia: NEGATIVE
NEISSERIA GONORRHEA: NEGATIVE

## 2016-09-06 LAB — RPR: RPR: NONREACTIVE

## 2016-12-10 ENCOUNTER — Emergency Department (HOSPITAL_COMMUNITY)
Admission: EM | Admit: 2016-12-10 | Discharge: 2016-12-10 | Disposition: A | Discharge status: Home / Self Care | Payer: Medicaid Other | Attending: Emergency Medicine | Admitting: Emergency Medicine

## 2016-12-10 ENCOUNTER — Encounter (HOSPITAL_COMMUNITY): Payer: Self-pay | Admitting: Emergency Medicine

## 2016-12-10 DIAGNOSIS — R1111 Vomiting without nausea: Secondary | ICD-10-CM | POA: Insufficient documentation

## 2016-12-10 DIAGNOSIS — Z5321 Procedure and treatment not carried out due to patient leaving prior to being seen by health care provider: Secondary | ICD-10-CM | POA: Diagnosis not present

## 2016-12-10 DIAGNOSIS — R350 Frequency of micturition: Secondary | ICD-10-CM | POA: Insufficient documentation

## 2016-12-10 NOTE — ED Notes (Signed)
Pt called from lobby with no response x2 

## 2016-12-10 NOTE — ED Notes (Signed)
Pt states she faints when blood is drawn, was advised by RN to wait til pt gets to room to draw labs

## 2016-12-10 NOTE — ED Triage Notes (Signed)
Pt reports she has had urinary frequency, burning, and foul smell to urine for the past 3 days. Pt also reports emesis after using Plan B pill. No emesis in triage.

## 2017-04-07 ENCOUNTER — Ambulatory Visit: Payer: Self-pay | Admitting: Family Medicine

## 2017-04-12 ENCOUNTER — Ambulatory Visit: Payer: Self-pay | Admitting: Family Medicine

## 2017-04-18 NOTE — L&D Delivery Note (Signed)
Pt was admitted in labor. She had SROM with mec noted. An amnioinfusion was started. She progressed along a nl labor curve. She pushed briefly and had a SVD of one live viable black female infant over an intact perineum in the ROA position. Nuchal x 1. Placenta-s/I. EBL-400cc. Baby to NBN. NICU present at delivery. She had bilateral labial tears closed with 3-0 chromic

## 2017-05-03 ENCOUNTER — Ambulatory Visit (INDEPENDENT_AMBULATORY_CARE_PROVIDER_SITE_OTHER): Payer: Medicaid Other | Admitting: Family Medicine

## 2017-05-03 ENCOUNTER — Encounter: Payer: Self-pay | Admitting: Family Medicine

## 2017-05-03 VITALS — BP 120/60 | HR 84 | Temp 97.9°F | Resp 16 | Ht 67.0 in | Wt 119.8 lb

## 2017-05-03 DIAGNOSIS — Z3200 Encounter for pregnancy test, result unknown: Secondary | ICD-10-CM | POA: Diagnosis not present

## 2017-05-03 DIAGNOSIS — R1084 Generalized abdominal pain: Secondary | ICD-10-CM

## 2017-05-03 DIAGNOSIS — Z1331 Encounter for screening for depression: Secondary | ICD-10-CM

## 2017-05-03 DIAGNOSIS — K30 Functional dyspepsia: Secondary | ICD-10-CM

## 2017-05-03 DIAGNOSIS — Z131 Encounter for screening for diabetes mellitus: Secondary | ICD-10-CM | POA: Diagnosis not present

## 2017-05-03 LAB — POCT URINE PREGNANCY: Preg Test, Ur: NEGATIVE

## 2017-05-03 LAB — POCT URINALYSIS DIP (DEVICE)
Bilirubin Urine: NEGATIVE
Glucose, UA: NEGATIVE mg/dL
Hgb urine dipstick: NEGATIVE
KETONES UR: NEGATIVE mg/dL
Leukocytes, UA: NEGATIVE
Nitrite: NEGATIVE
PH: 5.5 (ref 5.0–8.0)
PROTEIN: NEGATIVE mg/dL
SPECIFIC GRAVITY, URINE: 1.025 (ref 1.005–1.030)
Urobilinogen, UA: 0.2 mg/dL (ref 0.0–1.0)

## 2017-05-03 LAB — POCT GLYCOSYLATED HEMOGLOBIN (HGB A1C): Hemoglobin A1C: 5.5

## 2017-05-03 MED ORDER — RANITIDINE HCL 150 MG PO TABS: 150.0000 mg | ORAL_TABLET | Freq: Two times a day (BID) | ORAL | 0 refills | Status: DC

## 2017-05-03 NOTE — Patient Instructions (Signed)
Food Choices for Gastroesophageal Reflux Disease, Adult When you have gastroesophageal reflux disease (GERD), the foods you eat and your eating habits are very important. Choosing the right foods can help ease your discomfort. What guidelines do I need to follow?  Choose fruits, vegetables, whole grains, and low-fat dairy products.  Choose low-fat meat, fish, and poultry.  Limit fats such as oils, salad dressings, butter, nuts, and avocado.  Keep a food diary. This helps you identify foods that cause symptoms.  Avoid foods that cause symptoms. These may be different for everyone.  Eat small meals often instead of 3 large meals a day.  Eat your meals slowly, in a place where you are relaxed.  Limit fried foods.  Cook foods using methods other than frying.  Avoid drinking alcohol.  Avoid drinking large amounts of liquids with your meals.  Avoid bending over or lying down until 2-3 hours after eating. What foods are not recommended? These are some foods and drinks that may make your symptoms worse: Vegetables Tomatoes. Tomato juice. Tomato and spaghetti sauce. Chili peppers. Onion and garlic. Horseradish. Fruits Oranges, grapefruit, and lemon (fruit and juice). Meats High-fat meats, fish, and poultry. This includes hot dogs, ribs, ham, sausage, salami, and bacon. Dairy Whole milk and chocolate milk. Sour cream. Cream. Butter. Ice cream. Cream cheese. Drinks Coffee and tea. Bubbly (carbonated) drinks or energy drinks. Condiments Hot sauce. Barbecue sauce. Sweets/Desserts Chocolate and cocoa. Donuts. Peppermint and spearmint. Fats and Oils High-fat foods. This includes French fries and potato chips. Other Vinegar. Strong spices. This includes black pepper, white pepper, red pepper, cayenne, curry powder, cloves, ginger, and chili powder. The items listed above may not be a complete list of foods and drinks to avoid. Contact your dietitian for more information. This  information is not intended to replace advice given to you by your health care provider. Make sure you discuss any questions you have with your health care provider. Document Released: 10/04/2011 Document Revised: 09/10/2015 Document Reviewed: 02/06/2013 Elsevier Interactive Patient Education  2017 Elsevier Inc.  Gastroesophageal Reflux Disease, Adult Normally, food travels down the esophagus and stays in the stomach to be digested. If a person has gastroesophageal reflux disease (GERD), food and stomach acid move back up into the esophagus. When this happens, the esophagus becomes sore and swollen (inflamed). Over time, GERD can make small holes (ulcers) in the lining of the esophagus. Follow these instructions at home: Diet  Follow a diet as told by your doctor. You may need to avoid foods and drinks such as: ? Coffee and tea (with or without caffeine). ? Drinks that contain alcohol. ? Energy drinks and sports drinks. ? Carbonated drinks or sodas. ? Chocolate and cocoa. ? Peppermint and mint flavorings. ? Garlic and onions. ? Horseradish. ? Spicy and acidic foods, such as peppers, chili powder, curry powder, vinegar, hot sauces, and BBQ sauce. ? Citrus fruit juices and citrus fruits, such as oranges, lemons, and limes. ? Tomato-based foods, such as red sauce, chili, salsa, and pizza with red sauce. ? Fried and fatty foods, such as donuts, french fries, potato chips, and high-fat dressings. ? High-fat meats, such as hot dogs, rib eye steak, sausage, ham, and bacon. ? High-fat dairy items, such as whole milk, butter, and cream cheese.  Eat small meals often. Avoid eating large meals.  Avoid drinking large amounts of liquid with your meals.  Avoid eating meals during the 2-3 hours before bedtime.  Avoid lying down right after you eat.  Do   not exercise right after you eat. General instructions  Pay attention to any changes in your symptoms.  Take over-the-counter and prescription  medicines only as told by your doctor. Do not take aspirin, ibuprofen, or other NSAIDs unless your doctor says it is okay.  Do not use any tobacco products, including cigarettes, chewing tobacco, and e-cigarettes. If you need help quitting, ask your doctor.  Wear loose clothes. Do not wear anything tight around your waist.  Raise (elevate) the head of your bed about 6 inches (15 cm).  Try to lower your stress. If you need help doing this, ask your doctor.  If you are overweight, lose an amount of weight that is healthy for you. Ask your doctor about a safe weight loss goal.  Keep all follow-up visits as told by your doctor. This is important. Contact a doctor if:  You have new symptoms.  You lose weight and you do not know why it is happening.  You have trouble swallowing, or it hurts to swallow.  You have wheezing or a cough that keeps happening.  Your symptoms do not get better with treatment.  You have a hoarse voice. Get help right away if:  You have pain in your arms, neck, jaw, teeth, or back.  You feel sweaty, dizzy, or light-headed.  You have chest pain or shortness of breath.  You throw up (vomit) and your throw up looks like blood or coffee grounds.  You pass out (faint).  Your poop (stool) is bloody or black.  You cannot swallow, drink, or eat. This information is not intended to replace advice given to you by your health care provider. Make sure you discuss any questions you have with your health care provider. Document Released: 09/21/2007 Document Revised: 09/10/2015 Document Reviewed: 07/30/2014 Elsevier Interactive Patient Education  2018 Elsevier Inc.  

## 2017-05-03 NOTE — Progress Notes (Signed)
Patient ID: Angela Acosta, female    DOB: Sep 09, 1995, 22 y.o.   MRN: [id number]  PCP: Bing Neighbors, FNP  Chief Complaint  Patient presents with  . Abdominal Pain and Positive Depression Screen     Subjective:  HPI Angela Acosta is a 22 y.o. female presents for evaluation of abdominal pain. 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  Complains of ongoing abdominal discomfort, indigestion, belching, and flatulence.  She occasionally has associated nausea without vomiting.  Complains of associated burning in her throat.  She notices that symptoms are associated with certain foods such as milk, greasy food, and cheese. She has no history of acid reflux and has not attempted relief with any PPI or acid suppressant therapy. Angela Acosta had a positive depression screen today.  She denies any suicidal or homicidal ideations.  She reports being overwhelmed with a young child and balance and financial concerns with everyday life events.  She reports good emotional and family support from her mother and significant other.  She declines any counseling or medication therapy today. Social History   Socioeconomic History  . Marital status: Single    Spouse name: Not on file  . Number of children: Not on file  . Years of education: Not on file  . Highest education level: Not on file  Social Needs  . Financial resource strain: Not on file  . Food insecurity - worry: Not on file  . Food insecurity - inability: Not on file  . Transportation needs - medical: Not on file  . Transportation needs - non-medical: Not on file  Occupational History  . Not on file  Tobacco Use  . Smoking status: Never Smoker  . Smokeless tobacco: Never Used  Substance and Sexual Activity  . Alcohol use: Yes    Comment: rare  . Drug use: No  . Sexual activity: Yes    Birth control/protection: None  Other Topics Concern  . Not on file  Social History Narrative  . Not on file     Family History  Problem Relation Age of Onset  . Hypertension Father   . Cancer Paternal Grandmother      Review of Systems  HENT: Negative.   Respiratory: Negative.   Cardiovascular: Negative.   Gastrointestinal: Positive for abdominal pain and nausea. Negative for anal bleeding, constipation, diarrhea, rectal pain and vomiting.  Psychiatric/Behavioral: Positive for dysphoric mood. Negative for agitation, behavioral problems, confusion, decreased concentration, self-injury, sleep disturbance and suicidal ideas. The patient is nervous/anxious.     There are no active problems to display for this patient.   No Known Allergies  Prior to Admission medications   Not on File    Past Medical, Surgical Family and Social History reviewed and updated.    Objective:   Today's Vitals   05/03/17 1411  BP: 120/60  Pulse: 84  Resp: 16  Temp: 97.9 F (36.6 C)  TempSrc: Oral  SpO2: 100%  Weight: 119 lb 12.8 oz (54.3 kg)  Height: 5\' 7"  (1.702 m)    Wt Readings from Last 3 Encounters:  05/03/17 119 lb 12.8 oz (54.3 kg)  12/10/16 120 lb (54.4 kg)  06/20/16 120 lb (54.4 kg)    Physical Exam  Constitutional: She is oriented to person, place, and time. She appears well-developed and well-nourished.  HENT:  Head: Normocephalic and atraumatic.  Nose: Nose normal.  Mouth/Throat: Oropharynx is clear and moist.  Eyes: Conjunctivae and EOM are normal. Pupils are  equal, round, and reactive to light.  Neck: Normal range of motion.  Cardiovascular: Normal rate, regular rhythm, normal heart sounds and intact distal pulses.  Pulmonary/Chest: Effort normal and breath sounds normal.  Abdominal: Soft. Bowel sounds are normal. She exhibits no distension and no mass. There is no tenderness. There is no rebound and no guarding.  Musculoskeletal: Normal range of motion.  Neurological: She is alert and oriented to person, place, and time.  Psychiatric: She has a normal mood and affect. Her  behavior is normal. Judgment and thought content normal.   Assessment & Plan:  1. Generalized abdominal pain 2. Indigestion -Symptoms are consistent with acid reflux, as symptoms are provoked by certain foods. Will trial patient on acid suppressant therapy, ranitidine 150 mg twice daily.   3. Screening for diabetes mellitus, A1C. 5.5 4. Encounter for pregnancy test, result unknown, negative  5. Positive depression screening, patient is declines treatment as she feels symptoms are situational. She endorses a good family support system. Denies suicidal or homicidal ideation and decline any other interventions today.  RTC: 2 weeks to ensure resolution of symptoms and PAP   Godfrey PickKimberly S. Tiburcio PeaHarris, MSN, FNP-C [facility]  [address] [place], [place] [address] [phone]

## 2017-05-09 ENCOUNTER — Inpatient Hospital Stay (HOSPITAL_COMMUNITY)
Admission: AD | Admit: 2017-05-09 | Discharge: 2017-05-09 | Disposition: A | Discharge status: Home / Self Care | Payer: Medicaid Other | Source: Ambulatory Visit | Attending: Obstetrics and Gynecology | Admitting: Obstetrics and Gynecology

## 2017-05-09 ENCOUNTER — Encounter (HOSPITAL_COMMUNITY): Payer: Self-pay

## 2017-05-09 DIAGNOSIS — F430 Acute stress reaction: Secondary | ICD-10-CM | POA: Diagnosis not present

## 2017-05-09 DIAGNOSIS — F329 Major depressive disorder, single episode, unspecified: Secondary | ICD-10-CM

## 2017-05-09 DIAGNOSIS — F410 Panic disorder [episodic paroxysmal anxiety] without agoraphobia: Secondary | ICD-10-CM

## 2017-05-09 DIAGNOSIS — F32A Depression, unspecified: Secondary | ICD-10-CM

## 2017-05-09 LAB — POCT PREGNANCY, URINE: Preg Test, Ur: NEGATIVE

## 2017-05-09 MED ORDER — LORAZEPAM 1 MG PO TABS: 1.0000 mg | ORAL_TABLET | Freq: Once | ORAL | Status: DC

## 2017-05-09 MED ORDER — SERTRALINE HCL 25 MG PO TABS: 25.0000 mg | ORAL_TABLET | Freq: Every day | ORAL | 0 refills | Status: DC

## 2017-05-09 MED ORDER — LORAZEPAM 1 MG PO TABS
1.0000 mg | ORAL_TABLET | Freq: Once | ORAL | Status: AC
  Administered 2017-05-09: 1 mg via ORAL
  Filled 2017-05-09: qty 1

## 2017-05-09 NOTE — MAU Provider Note (Signed)
Chief Complaint: Panic Attack   First Provider Initiated Contact with Patient 05/09/17 0349      SUBJECTIVE HPI: Angela Acosta is a 22 y.o. G1P1001 not currently pregnant who presents to maternity admissions reporting panic attack. She reports heart racing, difficultly catching her breath, shaking, crying and anxiousness. She reports that she called EMS for her symptoms around 0230 where they stated she was having a panic attack. She reports having one other panic attack before in her life but a long time ago. She was recently dx with depression at a recent wellness visit but declined treatment or medication at that time. She is currently on her cycle and states vaginal bleeding and abdominal pain associated with that. 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 She denies being on birth control at this time. She denies vaginal itching/burning, urinary symptoms, h/a, dizziness, n/v, or fever/chills. She denies suicidal ideation or thoughts of harming others.   Past Medical History:  Diagnosis Date  . Anxiety    no medications  . Heart murmur    Past Surgical History:  Procedure Laterality Date  . NO PAST SURGERIES     Social History   Socioeconomic History  . Marital status: Single    Spouse name: Not on file  . Number of children: Not on file  . Years of education: Not on file  . Highest education level: Not on file  Social Needs  . Financial resource strain: Not on file  . Food insecurity - worry: Not on file  . Food insecurity - inability: Not on file  . Transportation needs - medical: Not on file  . Transportation needs - non-medical: Not on file  Occupational History  . Not on file  Tobacco Use  . Smoking status: Never Smoker  . Smokeless tobacco: Never Used  Substance and Sexual Activity  . Alcohol use: Yes    Comment: rare  . Drug use: No  . Sexual activity: Yes    Birth control/protection: None  Other Topics Concern  . Not on file  Social History  Narrative  . Not on file   No current facility-administered medications on file prior to encounter.    Current Outpatient Medications on File Prior to Encounter  Medication Sig Dispense Refill  . ranitidine (ZANTAC) 150 MG tablet Take 1 tablet (150 mg total) by mouth 2 (two) times daily. 60 tablet 0   No Known Allergies  ROS:  Review of Systems  Constitutional: Negative.   Respiratory: Positive for shortness of breath. Negative for cough, chest tightness and wheezing.   Cardiovascular: Negative for chest pain and palpitations.  Gastrointestinal: Positive for abdominal pain. Negative for constipation, diarrhea, nausea and vomiting.  Genitourinary: Positive for vaginal bleeding. Negative for difficulty urinating, dysuria, pelvic pain, urgency, vaginal discharge and vaginal pain.  Musculoskeletal: Negative.   Neurological: Negative.   Psychiatric/Behavioral: Positive for sleep disturbance. Negative for suicidal ideas. The patient is nervous/anxious.    I have reviewed patient's Past Medical Hx, Surgical Hx, Family Hx, Social Hx, medications and allergies.   Physical Exam   Patient Vitals for the past 24 hrs:  BP Temp Temp src Pulse Resp SpO2  05/09/17 0422 113/79 - - 82 17 -  05/09/17 0323 120/71 98.2 F (36.8 C) Oral 98 17 100 %   Constitutional: Well-developed, well-nourished female in mild acute distress and active panic attack.  Cardiovascular: normal rate Respiratory: normal effort GI: Abd soft, non-tender. Pos BS x 4 MS: Extremities  nontender, no edema, normal ROM Neurologic: Alert and oriented x 4.  GU: Neg CVAT. Pelvic exam: deferred   LAB RESULTS Results for orders placed or performed during the hospital encounter of 05/09/17 (from the past 24 hour(s))  Pregnancy, urine POC     Status: None   Collection Time: 05/09/17  3:21 AM  Result Value Ref Range   Preg Test, Ur NEGATIVE NEGATIVE   MAU Management/MDM: Orders Placed This Encounter  Procedures  . Pregnancy,  urine POC   Meds ordered this encounter  Medications  . LORazepam (ATIVAN) tablet 1 mg  . sertraline (ZOLOFT) 25 MG tablet    Sig: Take 1 tablet (25 mg total) by mouth daily.    Dispense:  30 tablet    Refill:  0   Treatments in MAU included 1mg  Ativan for active panic attack- patient feels better after medication and HR is down to normal, pt reports no trouble breathing after resolved panic attack.  Discussed recent dx of depression with patient and reason for declining medication treatment. Pt states that this stress is situational and she is so young she thought she would be able to get past her feelings, but she is stressed trying to balance her life her babies life and boyfriend's life. Educated on coping mechanisms with patient for depression and anxiety. Patient states she likes to ride in the car and take a shower, both mechanisms help her relax and feel better. Discussed options of medication for treatment at this time with the information that treatment starting today does not mean medication for the rest of her life- patient agrees to medication treatment and plans to be managed with PCP.   Pt discharged home. Pt stable at time of discharge.   ASSESSMENT 1. Panic attack as reaction to stress   2. Depression, unspecified depression type     PLAN Discharge home Rx for Zoloft for depression and anxiety  Follow up with PCP in 2 weeks from today for management of depression and follow up with new medication being started  Return to Urgent care or ED for worsening symptoms, thoughts of suicide or worsening panic attack.    Allergies as of 05/09/2017   No Known Allergies     Medication List    TAKE these medications   ranitidine 150 MG tablet Commonly known as:  ZANTAC Take 1 tablet (150 mg total) by mouth 2 (two) times daily.   sertraline 25 MG tablet Commonly known as:  ZOLOFT Take 1 tablet (25 mg total) by mouth daily.      Steward Drone  Certified  Nurse-Midwife 05/09/2017  3:50 AM

## 2017-05-09 NOTE — MAU Note (Signed)
Felt like her heart was racing, SOB, difficulty breathing when laying down tonight.  Called EMS-told her she was having a panic attack.  Started having a pain in her right side.  Was diagnosed with PID last year and was starting to have returning symptoms-lower abdominal pain.  States she has been having lower abdominal pain and pain with intercourse for the past week.  Reports recent nausea for the past 2 weeks.  Does not report ever having a panic attack before.  Not currently being treated for anxiety or depression.

## 2017-05-11 ENCOUNTER — Emergency Department (HOSPITAL_COMMUNITY)
Admission: EM | Admit: 2017-05-11 | Discharge: 2017-05-11 | Disposition: A | Discharge status: Home / Self Care | Payer: Medicaid Other | Attending: Emergency Medicine | Admitting: Emergency Medicine

## 2017-05-11 ENCOUNTER — Other Ambulatory Visit: Payer: Self-pay

## 2017-05-11 ENCOUNTER — Encounter (HOSPITAL_COMMUNITY): Payer: Self-pay | Admitting: *Deleted

## 2017-05-11 DIAGNOSIS — Z5321 Procedure and treatment not carried out due to patient leaving prior to being seen by health care provider: Secondary | ICD-10-CM | POA: Insufficient documentation

## 2017-05-11 DIAGNOSIS — F419 Anxiety disorder, unspecified: Secondary | ICD-10-CM | POA: Insufficient documentation

## 2017-05-11 NOTE — ED Provider Notes (Signed)
It appears patient left the department without being seen by a provider.    Angela Acosta, Angela Theissen, MD 05/11/17 [phone]

## 2017-05-11 NOTE — ED Triage Notes (Signed)
Pt c/o sob for the past 10 mins. Reports hx of anxiety does not take medication for anxiety. Has been under a lot of stress recently Pt started to hyperventilate in triage. Encouraged slow breathing

## 2017-05-12 ENCOUNTER — Encounter: Payer: Self-pay | Admitting: Family Medicine

## 2017-05-12 ENCOUNTER — Other Ambulatory Visit: Payer: Self-pay

## 2017-05-12 ENCOUNTER — Ambulatory Visit (INDEPENDENT_AMBULATORY_CARE_PROVIDER_SITE_OTHER): Payer: Medicaid Other | Admitting: Family Medicine

## 2017-05-12 VITALS — BP 107/63 | HR 64 | Temp 98.0°F | Ht 67.0 in | Wt 116.0 lb

## 2017-05-12 DIAGNOSIS — F09 Unspecified mental disorder due to known physiological condition: Secondary | ICD-10-CM

## 2017-05-12 DIAGNOSIS — F331 Major depressive disorder, recurrent, moderate: Secondary | ICD-10-CM | POA: Diagnosis not present

## 2017-05-12 DIAGNOSIS — F419 Anxiety disorder, unspecified: Secondary | ICD-10-CM

## 2017-05-12 DIAGNOSIS — Z711 Person with feared health complaint in whom no diagnosis is made: Secondary | ICD-10-CM

## 2017-05-12 MED ORDER — ESCITALOPRAM OXALATE 10 MG PO TABS: 10.0000 mg | ORAL_TABLET | Freq: Every day | ORAL | 1 refills | Status: DC

## 2017-05-12 MED ORDER — HYDROXYZINE HCL 25 MG PO TABS: 12.5000 mg | ORAL_TABLET | Freq: Three times a day (TID) | ORAL | 1 refills | Status: DC | PRN

## 2017-05-12 MED FILL — ESCITALOPRAM 10 MG TABLET: 10 | 30 days supply | Qty: 30 | Fill #0

## 2017-05-12 MED FILL — hydrOXYzine HCL 25 MG TABS: 25 | 10 days supply | Qty: 30 | Fill #0

## 2017-05-12 NOTE — Progress Notes (Signed)
la  Patient ID: Angela Acosta, female    DOB: 07/30/1995, 22 y.o.   MRN: [id number]  PCP: Bing NeighborsHarris, Espen Bethel S, FNP  Chief Complaint  Patient presents with  . Follow-up    Panic Attack 05/09/17 [facility]    Subjective:  HPI Angela Acosta is a 22 y.o. female presents for emergency department follow-up. [place] reports presenting to the emergency department with complaint of shortness of breath and chest tightness. She was diagnosed with panic attacks and prescribed Zoloft for which she has not filled or taken.    Shortness of breath continue intermittently since episode. Most pronounced at nighttime. 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 Reports being told in the past that she may have bipolar disorder.  She has obtained an appointment to establish with a counselor first week of February. Doesn't want to start Zoloft concerned of side effects and possible reaction. Feels she needs to take something to calm her down and improve overall mood.  Social History   Socioeconomic History  . Marital status: Single    Spouse name: Not on file  . Number of children: Not on file  . Years of education: Not on file  . Highest education level: Not on file  Social Needs  . Financial resource strain: Not on file  . Food insecurity - worry: Not on file  . Food insecurity - inability: Not on file  . Transportation needs - medical: Not on file  . Transportation needs - non-medical: Not on file  Occupational History  . Not on file  Tobacco Use  . Smoking status: Never Smoker  . Smokeless tobacco: Never Used  Substance and Sexual Activity  . Alcohol use: Yes    Comment: rare  . Drug use: No  . Sexual activity: Yes    Birth control/protection: None  Other Topics Concern  . Not on file  Social History Narrative  . Not on file    Family History  Problem Relation Age of Onset  . Hypertension Father   . Cancer Paternal Grandmother       Review of Systems  Constitutional: Positive for fatigue.  Respiratory: Positive for chest tightness and shortness of breath.   Cardiovascular: Negative.   Gastrointestinal: Negative.   Genitourinary:       Vaginal itching   Musculoskeletal: Negative.   Psychiatric/Behavioral: Positive for decreased concentration, dysphoric mood and sleep disturbance. Negative for suicidal ideas. The patient is nervous/anxious.     No Known Allergies  Prior to Admission medications   Medication Sig Start Date End Date Taking? Authorizing Provider  ranitidine (ZANTAC) 150 MG tablet Take 1 tablet (150 mg total) by mouth 2 (two) times daily. 05/03/17  Yes Bing NeighborsHarris, Julena Barbour S, FNP  sertraline (ZOLOFT) 25 MG tablet Take 1 tablet (25 mg total) by mouth daily. 05/09/17  Yes Sharyon Cableogers, Veronica C, CNM    Past Medical, Surgical Family and Social History reviewed and updated.    Objective:   Today's Vitals   05/12/17 1139  BP: 107/63  Pulse: 64  Temp: 98 F (36.7 C)  TempSrc: Oral  SpO2: 100%  Weight: 116 lb (52.6 kg)  Height: 5\' 7"  (1.702 m)  PainSc: 0-No pain    Wt Readings from Last 3 Encounters:  05/12/17 116 lb (52.6 kg)  05/03/17 119 lb 12.8 oz (54.3 kg)  12/10/16 120 lb (54.4 kg)    Physical Exam  Constitutional: She is oriented to person, place,  and time. She appears well-developed and well-nourished.  HENT:  Head: Normocephalic and atraumatic.  Eyes: Conjunctivae and EOM are normal. Pupils are equal, round, and reactive to light.  Neck: Normal range of motion. Neck supple.  Cardiovascular: Normal rate, regular rhythm, normal heart sounds and intact distal pulses.  Pulmonary/Chest: Effort normal. No respiratory distress. She has no wheezes. She has no rales.  Neurological: She is alert and oriented to person, place, and time.  Skin: Skin is warm and dry.  Psychiatric: Her speech is normal. Judgment and thought content normal. Her mood appears anxious. She is hyperactive. Cognition  and memory are normal. She exhibits a depressed mood.   Assessment & Plan:  1. Concern about STD in female without diagnosis, vaginal itching screen for BV, Candidiasis, GC/Chalymidia. Check NuSwab Vaginitis Plus (VG+) 2. Anxiety 3. Moderate episode of recurrent major depressive disorder (HCC) 4. 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 physical exam is negative for any abnormal lung sounds nor is patient exhibiting symptoms of active SOB today while in office.  I am discontinuing Zoloft and will trial her on escitalopram 10 mg at bedtime in addition to start her on hydroxyzine 12.5-25 mg 3 times daily for anxiety.  Recommend very close follow-up with behavioral health therapist.   RTC: 1 week PAP and 6 weeks follow-up of depression and anxiety.    Godfrey Pick. Tiburcio Pea, MSN, FNP-C [facility]  [address] [place], [place] [address] [phone]

## 2017-05-15 ENCOUNTER — Encounter: Payer: Self-pay | Admitting: Family Medicine

## 2017-05-15 ENCOUNTER — Ambulatory Visit (INDEPENDENT_AMBULATORY_CARE_PROVIDER_SITE_OTHER): Payer: Medicaid Other | Admitting: Family Medicine

## 2017-05-15 ENCOUNTER — Ambulatory Visit: Payer: Medicaid Other | Admitting: Family Medicine

## 2017-05-15 VITALS — BP 110/74 | HR 70 | Temp 98.4°F | Resp 16 | Ht 67.0 in | Wt 113.2 lb

## 2017-05-15 DIAGNOSIS — R0602 Shortness of breath: Secondary | ICD-10-CM

## 2017-05-15 DIAGNOSIS — Z01419 Encounter for gynecological examination (general) (routine) without abnormal findings: Secondary | ICD-10-CM | POA: Diagnosis not present

## 2017-05-15 LAB — NUSWAB VAGINITIS PLUS (VG+)
Candida albicans, NAA: NEGATIVE
Candida glabrata, NAA: NEGATIVE
Chlamydia trachomatis, NAA: NEGATIVE
NEISSERIA GONORRHOEAE, NAA: NEGATIVE
Trich vag by NAA: NEGATIVE

## 2017-05-15 MED ORDER — ALBUTEROL SULFATE HFA 108 (90 BASE) MCG/ACT IN AERS: 2.0000 | INHALATION_SPRAY | RESPIRATORY_TRACT | 1 refills | Status: AC | PRN

## 2017-05-15 MED FILL — PROVENTIL HFA 108 (90 BASE): 108 (90 BAS | 17 days supply | Qty: 7 | Fill #0

## 2017-05-15 NOTE — Patient Instructions (Addendum)
I would like for you to start albuterol inhaler 2 puffs every 4-6 hours as needed for shortness of breath. If symptoms worsen, go to the ED for further evaluation.   You will be notified of the results of your PAP and recent vaginal swab.    Preventive Care for [place], Female The transition to life after high school as a young adult can be a stressful time with many changes. You may start seeing a primary care physician instead of a pediatrician. This is the time when your health care becomes your responsibility. Preventive care refers to lifestyle choices and visits with your health care provider that can promote health and wellness. What does preventive care include?  A yearly physical exam. This is also called an annual wellness visit.  Dental exams once or twice a year.  Routine eye exams. Ask your health care provider how often you should have your eyes checked.  Personal lifestyle choices, including: ? Daily care of your teeth and gums. ? Regular physical activity. ? Eating a healthy diet. ? Avoiding tobacco and drug use. ? Avoiding or limiting alcohol use. ? Practicing safe sex. ? Taking vitamin and mineral supplements as recommended by your health care provider. What happens during an annual wellness visit? Preventive care starts with a yearly visit to your primary care physician. The services and screenings done by your health care provider during your annual wellness visit will depend on your overall health, lifestyle risk factors, and family history of disease. Counseling Your health care provider may ask you questions about:  Past medical problems and your family's medical history.  Medicines or supplements you take.  Health insurance and access to health care.  Alcohol, tobacco, and drug use.  Your safety at home, work, or school.  Access to firearms.  Emotional well-being and how you cope with stress.  Relationship well-being.  Diet, exercise, and  sleep habits.  Your sexual health and activity.  Your methods of birth control.  Your menstrual cycle.  Your pregnancy history.  Screening You may have the following tests or measurements:  Height, weight, and BMI.  Blood pressure.  Lipid and cholesterol levels.  Tuberculosis skin test.  Skin exam.  Vision and hearing tests.  Screening test for hepatitis.  Screening tests for sexually transmitted diseases (STDs), if you are at risk.  BRCA-related cancer screening. This may be done if you have a family history of breast, ovarian, tubal, or peritoneal cancers.  Pelvic exam and Pap test. This may be done every 3 years starting at age 50.  Vaccines Your health care provider may recommend certain vaccines, such as:  Influenza vaccine. This is recommended every year.  Tetanus, diphtheria, and acellular pertussis (Tdap, Td) vaccine. You may need a Td booster every 10 years.  Varicella vaccine. You may need this if you have not been vaccinated.  HPV vaccine. If you are 61 or younger, you may need three doses over 6 months.  Measles, mumps, and rubella (MMR) vaccine. You may need at least one dose of MMR. You may also need a second dose.  Pneumococcal 13-valent conjugate (PCV13) vaccine. You may need this if you have certain conditions and were not previously vaccinated.  Pneumococcal polysaccharide (PPSV23) vaccine. You may need one or two doses if you smoke cigarettes or if you have certain conditions.  Meningococcal vaccine. One dose is recommended if you are age 61-21 years and a first-year college student living in a residence hall, or if you have one  of several medical conditions. You may also need additional booster doses.  Hepatitis A vaccine. You may need this if you have certain conditions or if you travel or work in places where you may be exposed to hepatitis A.  Hepatitis B vaccine. You may need this if you have certain conditions or if you travel or work in  places where you may be exposed to hepatitis B.  Haemophilus influenzae type b (Hib) vaccine. You may need this if you have certain risk factors.  Talk to your health care provider about which screenings and vaccines you need and how often you need them. What steps can I take to develop healthy behaviors?  Have regular preventive health care visits with your primary care physician and dentist.  Eat a healthy diet.  Drink enough fluid to keep your urine clear or pale yellow.  Stay active. Exercise at least 30 minutes 5 or more days of the week.  Use alcohol responsibly.  Maintain a healthy weight.  Do not use any products that contain nicotine, such as cigarettes, chewing tobacco, and e-cigarettes. If you need help quitting, ask your health care provider.  Do not use drugs.  Practice safe sex.  Use birth control (contraception) to prevent unwanted pregnancy. If you plan to become pregnant, see your health care provider for a pre-conception visit.  Find healthy ways to manage stress. How can I protect myself from injury? Injuries from violence or accidents are the leading cause of death among young adults and can often be prevented. Take these steps to help protect yourself:  Always wear your seat belt while driving or riding in a vehicle.  Do not drive if you have been drinking alcohol. Do not ride with someone who has been drinking.  Do not drive when you are tired or distracted. Do not text while driving.  Wear a helmet and other protective equipment during sports activities.  If you have firearms in your house, make sure you follow all gun safety procedures.  Seek help if you have been bullied, physically abused, or sexually abused.  Use the Internet responsibly to avoid dangers such as online bullying and online sexual predators.  What can I do to cope with stress? Young adults may face many new challenges that can be stressful, such as finding a job, going to  college, moving away from home, managing money, being in a relationship, getting married, and having children. To manage stress:  Avoid known stressful situations when you can.  Exercise regularly.  Find a stress-reducing activity that works best for you. Examples include meditation, yoga, listening to music, or reading.  Spend time in nature.  Keep a journal to write about your stress and how you respond.  Talk to your health care provider about stress. He or she may suggest counseling.  Spend time with supportive friends or family.  Do not cope with stress by: ? Drinking alcohol or using drugs. ? Smoking cigarettes. ? Eating.  Where can I get more information? Learn more about preventive care and healthy habits from:  [place] and Gynecologists: KaraokeExchange.nl  U.S. Probation officer Task Force: StageSync.si  National Adolescent and [place]: StrategicRoad.nl  American Academy of Pediatrics Bright Futures: https://brightfutures.MemberVerification.co.za  Society for Adolescent Health and Medicine: MoralBlog.co.za.aspx  PodExchange.nl: ToyLending.fr  This information is not intended to replace advice given to you by your health care provider. Make sure you discuss any questions you have with your health care provider. Document  Released: 08/20/2015 Document Revised: 09/10/2015 Document Reviewed: 08/20/2015 Elsevier Interactive Patient Education  2018 [place] [employer], Adult Shortness of breath means you have trouble breathing. Your lungs are organs for breathing. Follow these instructions at home: Pay attention to any changes in your symptoms. Take these actions to help with your  condition:  Do not smoke. Smoking can cause shortness of breath. If you need help to quit smoking, ask your doctor.  Avoid things that can make it harder to breathe, such as: ? Mold. ? Dust. ? Air pollution. ? Chemical smells. ? Things that can cause allergy symptoms (allergens), if you have allergies.  Keep your living space clean and free of mold and dust.  Rest as needed. Slowly return to your usual activities.  Take over-the-counter and prescription medicines, including oxygen and inhaled medicines, only as told by your doctor.  Keep all follow-up visits as told by your doctor. This is important.  Contact a doctor if:  Your condition does not get better as soon as expected.  You have a hard time doing your normal activities, even after you rest.  You have new symptoms. Get help right away if:  You have trouble breathing when you are resting.  You feel light-headed or you faint.  You have a cough that is not helped by medicines.  You cough up blood.  You have pain with breathing.  You have pain in your chest, arms, shoulders, or belly (abdomen).  You have a fever.  You cannot walk up stairs.  You cannot exercise the way you normally do. This information is not intended to replace advice given to you by your health care provider. Make sure you discuss any questions you have with your health care provider. Document Released: 09/21/2007 Document Revised: 04/21/2016 Document Reviewed: 04/21/2016 Elsevier Interactive Patient Education  2017 [employer].

## 2017-05-15 NOTE — Progress Notes (Signed)
Patient ID: Angela Acosta, female    DOB: April 11, 1996, 22 y.o.   MRN: [id number]  PCP: Bing Neighbors, FNP  Chief Complaint  Patient presents with  . Gynecologic Exam    Subjective:  HPI Angela Acosta is a 22 y.o. female presents for routine gynecological exam. 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 She had experienced vaginal odor and itching during visit on Friday and reports resolution today of symptoms. Denies dysuria, pelvic pain, or flank pain. Patient's last menstrual period was 05/08/2017 (approximate).  Recent negative pregnancy test. She continues to complain about shortness of breath. These episodes were recently evaluated and attributed to worsening uncontrolled anxiety. Reports that she still has not picked up medications prescribed for anxiety and plans to pick up the medications today. Denies SI , HI or chest pain. Social History   Socioeconomic History  . Marital status: Single    Spouse name: Not on file  . Number of children: Not on file  . Years of education: Not on file  . Highest education level: Not on file  Social Needs  . Financial resource strain: Not on file  . Food insecurity - worry: Not on file  . Food insecurity - inability: Not on file  . Transportation needs - medical: Not on file  . Transportation needs - non-medical: Not on file  Occupational History  . Not on file  Tobacco Use  . Smoking status: Never Smoker  . Smokeless tobacco: Never Used  Substance and Sexual Activity  . Alcohol use: Yes    Comment: rare  . Drug use: No  . Sexual activity: Yes    Birth control/protection: None  Other Topics Concern  . Not on file  Social History Narrative  . Not on file    Family History  Problem Relation Age of Onset  . Hypertension Father   . Cancer Paternal Grandmother    Review of Systems  No Known Allergies  Prior to Admission  medications   Medication Sig Start Date End Date Taking? Authorizing Provider  escitalopram (LEXAPRO) 10 MG tablet Take 1 tablet (10 mg total) by mouth at bedtime. 05/12/17  Yes Bing Neighbors, FNP  hydrOXYzine (ATARAX/VISTARIL) 25 MG tablet Take 0.5-1 tablets (12.5-25 mg total) by mouth every 8 (eight) hours as needed for itching. 05/12/17  Yes Bing Neighbors, FNP  ranitidine (ZANTAC) 150 MG tablet Take 1 tablet (150 mg total) by mouth 2 (two) times daily. 05/03/17  Yes Bing Neighbors, FNP  albuterol (PROVENTIL HFA;VENTOLIN HFA) 108 (90 Base) MCG/ACT inhaler Inhale 2 puffs into the lungs every 4 (four) hours as needed for wheezing or shortness of breath (cough, shortness of breath or wheezing.). 05/15/17   Bing Neighbors, FNP    Past Medical, Surgical Family and Social History reviewed and updated.    Objective:   Today's Vitals   05/15/17 1508  BP: 110/74  Pulse: 70  Resp: 16  Temp: 98.4 F (36.9 C)  TempSrc: Oral  SpO2: 97%  Weight: 113 lb 3.2 oz (51.3 kg)  Height: 5\' 7"  (1.702 m)    Wt Readings from Last 3 Encounters:  05/15/17 113 lb 3.2 oz (51.3 kg)  05/12/17 116 lb (52.6 kg)  05/03/17 119 lb 12.8 oz (54.3 kg)    Physical Exam  Constitutional: She is oriented to person, place, and time. She appears well-developed and well-nourished.  HENT:  Head: Normocephalic  and atraumatic.  Eyes: Conjunctivae and EOM are normal. Pupils are equal, round, and reactive to light.  Neck: Normal range of motion. Neck supple.  Cardiovascular: Normal rate, regular rhythm, normal heart sounds and intact distal pulses.  Pulmonary/Chest: Effort normal and breath sounds normal.  Abdominal: Soft. Bowel sounds are normal.  Genitourinary: Vagina normal. No vaginal discharge found.  Genitourinary Comments: Breasts are symmetric without cutaneous changes, nipple inversion or discharge. No masses or tenderness, and no axillary lymphadenopathy. Normal female external genitalia without  lesion. No inguinal lymphadenopathy. Vaginal mucosa is pink and moist without lesions. Cervix is closed without discharge, not friable. Pap smear obtained. No cervical motion tenderness, adnexal fullness or tenderness.  Musculoskeletal: Normal range of motion.  Neurological: She is alert and oriented to person, place, and time.  Skin: Skin is warm and dry.  Psychiatric: She has a normal mood and affect. Her behavior is normal. Judgment and thought content normal.   Assessment & Plan:  1. Gynecologic exam normal, PAP specimen obtained.  - PapIG HPV, CtNg Age Gdln ACOG  2. Shortness of breath, unknown cause. 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  Meds ordered this encounter  Medications  . albuterol (PROVENTIL HFA;VENTOLIN HFA) 108 (90 Base) MCG/ACT inhaler    Sig: Inhale 2 puffs into the lungs every 4 (four) hours as needed for wheezing or shortness of breath (cough, shortness of breath or wheezing.).    Dispense:  1 Inhaler    Refill:  1    Order Specific Question:   Supervising Provider    Answer:   Quentin AngstJEGEDE, OLUGBEMIGA E [[id number]]    RTC: follow-up scheduled on file    Kendra Woolford S. Tiburcio PeaHarris, MSN, FNP-C [facility]  [address] [place], [place] [address] [phone]

## 2017-05-15 NOTE — Progress Notes (Deleted)
Cardiology Office Note   Date:  05/15/2017   ID:  Angela Acosta, DOB 1995-12-28, MRN [id number]  PCP:  Bing Neighbors, FNP  Cardiologist:   No primary care provider on file. Referring:  ***  No chief complaint on file.     History of Present Illness: Angela Acosta is a 22 y.o. female who is referred by Bing Neighbors, FNP for evaluation of SOB.  She was in the ED on 1/24 but left without being seen.  ***  Past Medical History:  Diagnosis Date  . Anxiety    no medications  . Heart murmur     Past Surgical History:  Procedure Laterality Date  . NO PAST SURGERIES       Current Outpatient Medications  Medication Sig Dispense Refill  . albuterol (PROVENTIL HFA;VENTOLIN HFA) 108 (90 Base) MCG/ACT inhaler Inhale 2 puffs into the lungs every 4 (four) hours as needed for wheezing or shortness of breath (cough, shortness of breath or wheezing.). 1 Inhaler 1  . escitalopram (LEXAPRO) 10 MG tablet Take 1 tablet (10 mg total) by mouth at bedtime. 60 tablet 1  . hydrOXYzine (ATARAX/VISTARIL) 25 MG tablet Take 0.5-1 tablets (12.5-25 mg total) by mouth every 8 (eight) hours as needed for itching. 30 tablet 1  . ranitidine (ZANTAC) 150 MG tablet Take 1 tablet (150 mg total) by mouth 2 (two) times daily. 60 tablet 0   No current facility-administered medications for this visit.     Allergies:   Patient has no known allergies.    Social History:  The patient  reports that  has never smoked. she has never used smokeless tobacco. She reports that she drinks alcohol. She reports that she does not use drugs.   Family History:  The patient's ***family history includes Cancer in her paternal grandmother; Hypertension in her father.    ROS:  Please see the history of present illness.   Otherwise, review of systems are positive for {NONE DEFAULTED:18576::"none"}.   All other systems are reviewed and negative.    PHYSICAL EXAM: VS:  LMP 05/08/2017 (Approximate)  , BMI There is  no height or weight on file to calculate BMI. GENERAL:  Well appearing HEENT:  Pupils equal round and reactive, fundi not visualized, oral mucosa unremarkable NECK:  No jugular venous distention, waveform within normal limits, carotid upstroke brisk and symmetric, no bruits, no thyromegaly LYMPHATICS:  No cervical, inguinal adenopathy LUNGS:  Clear to auscultation bilaterally BACK:  No CVA tenderness CHEST:  Unremarkable HEART:  PMI not displaced or sustained,S1 and S2 within normal limits, no S3, no S4, no clicks, no rubs, *** murmurs ABD:  Flat, positive bowel sounds normal in frequency in pitch, no bruits, no rebound, no guarding, no midline pulsatile mass, no hepatomegaly, no splenomegaly EXT:  2 plus pulses throughout, no edema, no cyanosis no clubbing SKIN:  No rashes no nodules NEURO:  Cranial nerves II through XII grossly intact, motor grossly intact throughout PSYCH:  Cognitively intact, oriented to person place and time    EKG:  EKG {ACTION; IS/IS VOZ:36644034} ordered today. The ekg ordered today demonstrates ***   Recent Labs: 06/20/2016: ALT 9; BUN 14; Creatinine, Ser 0.76; Potassium 3.9; Sodium 136 09/05/2016: Hemoglobin 11.8; Platelets 231    Lipid Panel No results found for: CHOL, TRIG, HDL, CHOLHDL, VLDL, LDLCALC, LDLDIRECT    Wt Readings from Last 3 Encounters:  05/15/17 113 lb 3.2 oz (51.3 kg)  05/12/17 116 lb (52.6 kg)  05/03/17  119 lb 12.8 oz (54.3 kg)      Other studies Reviewed: Additional studies/ records that were reviewed today include: ***. Review of the above records demonstrates:  Please see elsewhere in the note.  ***   ASSESSMENT AND PLAN:  ***   Current medicines are reviewed at length with the patient today.  The patient {ACTIONS; HAS/DOES NOT HAVE:19233} concerns regarding medicines.  The following changes have been made:  {PLAN; NO CHANGE:13088:s}  Labs/ tests ordered today include: *** No orders of the defined types were placed in  this encounter.    Disposition:   FU with ***    Signed, Rollene RotundaJames Lumen Brinlee, MD  05/15/2017 5:15 PM    [facility]

## 2017-05-16 ENCOUNTER — Ambulatory Visit: Payer: Medicaid Other | Admitting: Cardiology

## 2017-05-17 ENCOUNTER — Ambulatory Visit: Payer: Medicaid Other | Admitting: Family Medicine

## 2017-05-18 ENCOUNTER — Telehealth: Payer: Self-pay | Admitting: Family Medicine

## 2017-05-18 LAB — PAP IG, CT-NG, RFX HPV ASCU
Chlamydia, Nuc. Acid Amp: NEGATIVE
Gonococcus by Nucleic Acid Amp: NEGATIVE
PAP Smear Comment: 0

## 2017-05-18 LAB — PAPIG HPV, CTNG AGE GDLN ACOG

## 2017-05-18 NOTE — Telephone Encounter (Signed)
Mail results letter indicating normal PAP to patient.   Godfrey PickKimberly S. Tiburcio PeaHarris, MSN, FNP-C [facility]  [address] [place], [place] [address] [phone]

## 2017-05-18 NOTE — Progress Notes (Signed)
Cardiology Office Note   Date:  05/19/2017   ID:  Angela Acosta, DOB July 09, 1995, MRN [id number]  PCP:  Bing Neighbors, FNP no Cardiologist:   No primary care provider on file. Referring:  Bing Neighbors, FNP  Chief Complaint  Patient presents with  . Palpitations      History of Present Illness: Angela Acosta is a 22 y.o. female who is referred by Bing Neighbors, FNP for evaluation of SOB.  She was in the ED on 1/24 but left without being seen.  She reported today that this happened she was at home not doing anything in particular.  She thinks she was lying down.  Her heart rate started to speed up.  She felt somewhat panicked.  She was short of breath.  Her hands became numb and start the spasm.  She felt lightheaded and almost felt like she was going to pass out.  She called 911.  She was on the ground and they had to pick her up and evaluate her but they apparently did not do an EKG.  They told her she was having panic attack.  Was taken to the emergency room but she was not seen and by the time she got there she had resolution of her rapid heart rate and she started to recover so she left.  She did see her primary care physician.  She is given albuterol and she has noticed some improvement with this.  She was to start Lexapro but she did not start this.  She is had no further tachypalpitations since that time although she does get short of breath with activities.  Interestingly she is had a documented fluctuating weight and I see that is been up and down by about 25 pounds in a few days without clear explanation.  He she has not had any swelling.  She does not have any PND or orthopnea.  She is thin and currently at a low weight.  Of note she had a heart murmur when she was younger but otherwise has had no cardiovascular problems or workup.  She takes care of her young child and does household chores without limitations other than some mild shortness of breath as described.   There is no family history  Past Medical History:  Diagnosis Date  . Heart murmur     Past Surgical History:  Procedure Laterality Date  . NO PAST SURGERIES       Current Outpatient Medications  Medication Sig Dispense Refill  . albuterol (PROVENTIL HFA;VENTOLIN HFA) 108 (90 Base) MCG/ACT inhaler Inhale 2 puffs into the lungs every 4 (four) hours as needed for wheezing or shortness of breath (cough, shortness of breath or wheezing.). 1 Inhaler 1  . escitalopram (LEXAPRO) 10 MG tablet Take 1 tablet (10 mg total) by mouth at bedtime. 60 tablet 1  . hydrOXYzine (ATARAX/VISTARIL) 25 MG tablet Take 0.5-1 tablets (12.5-25 mg total) by mouth every 8 (eight) hours as needed for itching. 30 tablet 1  . ranitidine (ZANTAC) 150 MG tablet Take 1 tablet (150 mg total) by mouth 2 (two) times daily. 60 tablet 0   No current facility-administered medications for this visit.     Allergies:   Patient has no known allergies.    Social History:  The patient  reports that  has never smoked. she has never used smokeless tobacco. She reports that she drinks alcohol. She reports that she does not use drugs.   Family History:  The patient's family history includes Cancer in her paternal grandmother; Hypertension in her father.    ROS:  Please see the history of present illness.   Otherwise, review of systems are positive for insomnia..   All other systems are reviewed and negative.    PHYSICAL EXAM: VS:  BP 108/69   Pulse 76   Ht 5\' 7"  (1.702 m)   Wt 114 lb 6.4 oz (51.9 kg)   LMP 05/08/2017 (Approximate)   BMI 17.92 kg/m  , BMI Body mass index is 17.92 kg/m. GENERAL:  Well appearing HEENT:  Pupils equal round and reactive, fundi not visualized, oral mucosa unremarkable NECK:  No jugular venous distention, waveform within normal limits, carotid upstroke brisk and symmetric, no bruits, no thyromegaly LYMPHATICS:  No cervical, inguinal adenopathy LUNGS:  Clear to auscultation bilaterally BACK:   No CVA tenderness CHEST:  Unremarkable HEART:  PMI not displaced or sustained,S1 and S2 within normal limits, no S3, no S4, no clicks, no rubs, no murmurs ABD:  Flat, positive bowel sounds normal in frequency in pitch, no bruits, no rebound, no guarding, no midline pulsatile mass, no hepatomegaly, no splenomegaly EXT:  2 plus pulses throughout, no edema, no cyanosis no clubbing SKIN:  No rashes no nodules NEURO:  Cranial nerves II through XII grossly intact, motor grossly intact throughout PSYCH:  Cognitively intact, oriented to person place and time    EKG:  EKG is not ordered today. The ekg ordered 05/11/17 demonstrates sinus rhythm, rate 95, axis within normal limits, intervals within normal limits, no acute ST-T wave changes.   Recent Labs: 06/20/2016: ALT 9; BUN 14; Creatinine, Ser 0.76; Potassium 3.9; Sodium 136 09/05/2016: Hemoglobin 11.8; Platelets 231    Lipid Panel No results found for: CHOL, TRIG, HDL, CHOLHDL, VLDL, LDLCALC, LDLDIRECT    Wt Readings from Last 3 Encounters:  05/19/17 114 lb 6.4 oz (51.9 kg)  05/15/17 113 lb 3.2 oz (51.3 kg)  05/12/17 116 lb (52.6 kg)      Other studies Reviewed: Additional studies/ records that were reviewed today include: Primary care records. . Review of the above records demonstrates:  Please see elsewhere in the note.     ASSESSMENT AND PLAN:  PALPITATIONS: We discussed getting an Alive Cor device.  At this point I don't think another type of monitor would be helpful since she has had only one event.  I will check a TSH.  SOB.  I will check an echo.  If this is normal I would not suggest further cardiac evaluation other than above.    Current medicines are reviewed at length with the patient today.  The patient does not have concerns regarding medicines.  The following changes have been made:  no change  Labs/ tests ordered today include:   Orders Placed This Encounter  Procedures  . TSH  . ECHOCARDIOGRAM COMPLETE      Disposition:   FU with me as needed.      Signed, Rollene RotundaJames Xitlali Kastens, MD  05/19/2017 3:31 PM    [facility]

## 2017-05-18 NOTE — Telephone Encounter (Signed)
Results mailed 

## 2017-05-19 ENCOUNTER — Ambulatory Visit (INDEPENDENT_AMBULATORY_CARE_PROVIDER_SITE_OTHER): Payer: Medicaid Other | Admitting: Cardiology

## 2017-05-19 ENCOUNTER — Encounter: Payer: Self-pay | Admitting: Cardiology

## 2017-05-19 VITALS — BP 108/69 | HR 76 | Ht 67.0 in | Wt 114.4 lb

## 2017-05-19 DIAGNOSIS — R0602 Shortness of breath: Secondary | ICD-10-CM | POA: Diagnosis not present

## 2017-05-19 DIAGNOSIS — R002 Palpitations: Secondary | ICD-10-CM | POA: Diagnosis not present

## 2017-05-19 DIAGNOSIS — R5383 Other fatigue: Secondary | ICD-10-CM

## 2017-05-19 NOTE — Patient Instructions (Signed)
Medication Instructions:  Continue current medications  If you need a refill on your cardiac medications before your next appointment, please call your pharmacy.  Labwork: TSH Today HERE IN OUR OFFICE AT [facility]  Take the provided lab slips for you to take with you to the lab for you blood draw.   You will NOT need to fast   You may go to any [facility] lab that is convenient for you however, we do have a lab in our office that is able to assist you. You do NOT need an appointment for our lab. Once in our office lobby there is a podium to the right of the check-in desk where you are to sign-in and ring a doorbell to alert us you are here. Lab is open Monday-Friday from 8:00am to 4:00pm; and is closed for lunch from 12:45p-1:45pm   Testing/Procedures: Your physician has requested that you have an echocardiogram. Echocardiography is a painless test that uses sound waves to create images of your heart. It provides your doctor with information about the size and shape of your heart and how well your heart's chambers and valves are working. This procedure takes approximately one hour. There are no restrictions for this procedure.   Follow-Up: Your physician wants you to follow-up in: As Needed.     Thank you for choosing [facility]!!

## 2017-05-20 LAB — TSH: TSH: 0.593 u[IU]/mL (ref 0.450–4.500)

## 2017-05-26 ENCOUNTER — Other Ambulatory Visit (HOSPITAL_COMMUNITY): Payer: Medicaid Other

## 2017-05-30 ENCOUNTER — Other Ambulatory Visit (HOSPITAL_COMMUNITY): Payer: Medicaid Other

## 2017-06-01 ENCOUNTER — Other Ambulatory Visit (HOSPITAL_COMMUNITY): Payer: Medicaid Other

## 2017-06-07 ENCOUNTER — Other Ambulatory Visit (HOSPITAL_COMMUNITY): Payer: Medicaid Other

## 2017-06-12 ENCOUNTER — Telehealth (HOSPITAL_COMMUNITY): Payer: Self-pay | Admitting: Cardiology

## 2017-06-12 NOTE — Telephone Encounter (Signed)
Patient has cancelled an appt on 2/8 and no-showed twice on 2/12 and 2/20. She will be removed from the workqueue.

## 2017-06-23 ENCOUNTER — Ambulatory Visit: Payer: Medicaid Other | Admitting: Family Medicine

## 2017-08-08 ENCOUNTER — Encounter (HOSPITAL_COMMUNITY): Payer: Self-pay | Admitting: *Deleted

## 2017-08-08 ENCOUNTER — Inpatient Hospital Stay (HOSPITAL_COMMUNITY)
Admission: AD | Admit: 2017-08-08 | Discharge: 2017-08-08 | Disposition: A | Discharge status: Home / Self Care | Payer: Medicaid Other | Source: Ambulatory Visit | Attending: Family Medicine | Admitting: Family Medicine

## 2017-08-08 ENCOUNTER — Other Ambulatory Visit: Payer: Self-pay

## 2017-08-08 DIAGNOSIS — R109 Unspecified abdominal pain: Secondary | ICD-10-CM | POA: Insufficient documentation

## 2017-08-08 DIAGNOSIS — O26891 Other specified pregnancy related conditions, first trimester: Secondary | ICD-10-CM | POA: Insufficient documentation

## 2017-08-08 DIAGNOSIS — Z3A09 9 weeks gestation of pregnancy: Secondary | ICD-10-CM | POA: Insufficient documentation

## 2017-08-08 DIAGNOSIS — N949 Unspecified condition associated with female genital organs and menstrual cycle: Secondary | ICD-10-CM | POA: Diagnosis not present

## 2017-08-08 HISTORY — DX: Panic disorder (episodic paroxysmal anxiety): F41.0

## 2017-08-08 HISTORY — DX: Anemia, unspecified: D64.9

## 2017-08-08 LAB — URINALYSIS, ROUTINE W REFLEX MICROSCOPIC
BILIRUBIN URINE: NEGATIVE
Glucose, UA: 50 mg/dL — AB
Hgb urine dipstick: NEGATIVE
Ketones, ur: NEGATIVE mg/dL
LEUKOCYTES UA: NEGATIVE
NITRITE: NEGATIVE
PH: 7 (ref 5.0–8.0)
Protein, ur: NEGATIVE mg/dL
Specific Gravity, Urine: 1.02 (ref 1.005–1.030)

## 2017-08-08 NOTE — Discharge Instructions (Signed)
Round Ligament Pain The round ligament is a cord of muscle and tissue that helps to support the uterus. It can become a source of pain during pregnancy if it becomes stretched or twisted as the baby grows. The pain usually begins in the second trimester of pregnancy, and it can come and go until the baby is delivered. It is not a serious problem, and it does not cause harm to the baby. Round ligament pain is usually a short, sharp, and pinching pain, but it can also be a dull, lingering, and aching pain. The pain is felt in the lower side of the abdomen or in the groin. It usually starts deep in the groin and moves up to the outside of the hip area. Pain can occur with:  A sudden change in position.  Rolling over in bed.  Coughing or sneezing.  Physical activity.  Follow these instructions at home: Watch your condition for any changes. Take these steps to help with your pain:  When the pain starts, relax. Then try: ? Sitting down. ? Flexing your knees up to your abdomen. ? Lying on your side with one pillow under your abdomen and another pillow between your legs. ? Sitting in a warm bath for 15-20 minutes or until the pain goes away.  Take over-the-counter and prescription medicines only as told by your health care provider.  Move slowly when you sit and stand.  Avoid long walks if they cause pain.  Stop or lessen your physical activities if they cause pain.  Contact a health care provider if:  Your pain does not go away with treatment.  You feel pain in your back that you did not have before.  Your medicine is not helping. Get help right away if:  You develop a fever or chills.  You develop uterine contractions.  You develop vaginal bleeding.  You develop nausea or vomiting.  You develop diarrhea.  You have pain when you urinate. This information is not intended to replace advice given to you by your health care provider. Make sure you discuss any questions you have  with your health care provider. Document Released: 01/12/2008 Document Revised: 09/10/2015 Document Reviewed: 06/11/2014 Elsevier Interactive Patient Education  2018 [employer]. First Trimester of Pregnancy The first trimester of pregnancy is from week 1 until the end of week 13 (months 1 through 3). During this time, your baby will begin to develop inside you. At 6-8 weeks, the eyes and face are formed, and the heartbeat can be seen on ultrasound. At the end of 12 weeks, all the baby's organs are formed. Prenatal care is all the medical care you receive before the birth of your baby. Make sure you get good prenatal care and follow all of your doctor's instructions. Follow these instructions at home: Medicines  Take over-the-counter and prescription medicines only as told by your doctor. Some medicines are safe and some medicines are not safe during pregnancy.  Take a prenatal vitamin that contains at least 600 micrograms (mcg) of folic acid.  If you have trouble pooping (constipation), take medicine that will make your stool soft (stool softener) if your doctor approves. Eating and drinking  Eat regular, healthy meals.  Your doctor will tell you the amount of weight gain that is right for you.  Avoid raw meat and uncooked cheese.  If you feel sick to your stomach (nauseous) or throw up (vomit): ? Eat 4 or 5 small meals a day instead of 3 large meals. ?  Try eating a few soda crackers. ? Drink liquids between meals instead of during meals.  To prevent constipation: ? Eat foods that are high in fiber, like fresh fruits and vegetables, whole grains, and beans. ? Drink enough fluids to keep your pee (urine) clear or pale yellow. Activity  Exercise only as told by your doctor. Stop exercising if you have cramps or pain in your lower belly (abdomen) or low back.  Do not exercise if it is too hot, too humid, or if you are in a place of great height (high altitude).  Try to avoid  standing for long periods of time. Move your legs often if you must stand in one place for a long time.  Avoid heavy lifting.  Wear low-heeled shoes. Sit and stand up straight.  You can have sex unless your doctor tells you not to. Relieving pain and discomfort  Wear a good support bra if your breasts are sore.  Take warm water baths (sitz baths) to soothe pain or discomfort caused by hemorrhoids. Use hemorrhoid cream if your doctor says it is okay.  Rest with your legs raised if you have leg cramps or low back pain.  If you have puffy, bulging veins (varicose veins) in your legs: ? Wear support hose or compression stockings as told by your doctor. ? Raise (elevate) your feet for 15 minutes, 3-4 times a day. ? Limit salt in your food. Prenatal care  Schedule your prenatal visits by the twelfth week of pregnancy.  Write down your questions. Take them to your prenatal visits.  Keep all your prenatal visits as told by your doctor. This is important. Safety  Wear your seat belt at all times when driving.  Make a list of emergency phone numbers. The list should include numbers for family, friends, the hospital, and police and fire departments. General instructions  Ask your doctor for a referral to a local prenatal class. Begin classes no later than at the start of month 6 of your pregnancy.  Ask for help if you need counseling or if you need help with nutrition. Your doctor can give you advice or tell you where to go for help.  Do not use hot tubs, steam rooms, or saunas.  Do not douche or use tampons or scented sanitary pads.  Do not cross your legs for long periods of time.  Avoid all herbs and alcohol. Avoid drugs that are not approved by your doctor.  Do not use any tobacco products, including cigarettes, chewing tobacco, and electronic cigarettes. If you need help quitting, ask your doctor. You may get counseling or other support to help you quit.  Avoid cat litter  boxes and soil used by cats. These carry germs that can cause birth defects in the baby and can cause a loss of your baby (miscarriage) or stillbirth.  Visit your dentist. At home, brush your teeth with a soft toothbrush. Be gentle when you floss. Contact a doctor if:  You are dizzy.  You have mild cramps or pressure in your lower belly.  You have a nagging pain in your belly area.  You continue to feel sick to your stomach, you throw up, or you have watery poop (diarrhea).  You have a bad smelling fluid coming from your vagina.  You have pain when you pee (urinate).  You have increased puffiness (swelling) in your face, hands, legs, or ankles. Get help right away if:  You have a fever.  You are leaking fluid from  your vagina.  You have spotting or bleeding from your vagina.  You have very bad belly cramping or pain.  You gain or lose weight rapidly.  You throw up blood. It may look like coffee grounds.  You are around people who have [place] measles, fifth disease, or chickenpox.  You have a very bad headache.  You have shortness of breath.  You have any kind of trauma, such as from a fall or a car accident. Summary  The first trimester of pregnancy is from week 1 until the end of week 13 (months 1 through 3).  To take care of yourself and your unborn baby, you will need to eat healthy meals, take medicines only if your doctor tells you to do so, and do activities that are safe for you and your baby.  Keep all follow-up visits as told by your doctor. This is important as your doctor will have to ensure that your baby is healthy and growing well. This information is not intended to replace advice given to you by your health care provider. Make sure you discuss any questions you have with your health care provider. Document Released: 09/21/2007 Document Revised: 04/12/2016 Document Reviewed: 04/12/2016 Elsevier Interactive Patient Education  2017 [employer]

## 2017-08-08 NOTE — MAU Provider Note (Signed)
History     CSN: [id number]  Arrival date and time: 08/08/17 1240   First Provider Initiated Contact with Patient 08/08/17 1308      Chief Complaint  Patient presents with  . Abdominal Pain   HPI Angela Acosta is a 22 y.o. G2P1001 at [redacted]w[redacted]d who presents to MAU today with complaint of left sided abdominal pain in the mornings. She denies pain now. She has tried Tylenol for pain and it helps. She states pain has been intermittent for a few weeks. She denies vaginal bleeding or UTI symptoms.   OB History    Gravida  2   Para  1   Term  1   Preterm      AB      Living  1     SAB      TAB      Ectopic      Multiple  0   Live Births  1           Past Medical History:  Diagnosis Date  . Anemia   . Heart murmur   . Panic disorder     Past Surgical History:  Procedure Laterality Date  . NO PAST SURGERIES      Family History  Problem Relation Age of Onset  . Hypertension Father   . Cancer Paternal Grandmother     Social History   Tobacco Use  . Smoking status: Never Smoker  . Smokeless tobacco: Never Used  Substance Use Topics  . Alcohol use: Yes    Comment: rare  . Drug use: No    Allergies: No Known Allergies  Medications Prior to Admission  Medication Sig Dispense Refill Last Dose  . albuterol (PROVENTIL HFA;VENTOLIN HFA) 108 (90 Base) MCG/ACT inhaler Inhale 2 puffs into the lungs every 4 (four) hours as needed for wheezing or shortness of breath (cough, shortness of breath or wheezing.). 1 Inhaler 1 Taking  . escitalopram (LEXAPRO) 10 MG tablet Take 1 tablet (10 mg total) by mouth at bedtime. 60 tablet 1 Taking  . hydrOXYzine (ATARAX/VISTARIL) 25 MG tablet Take 0.5-1 tablets (12.5-25 mg total) by mouth every 8 (eight) hours as needed for itching. 30 tablet 1 Taking  . ranitidine (ZANTAC) 150 MG tablet Take 1 tablet (150 mg total) by mouth 2 (two) times daily. 60 tablet 0 Taking    Review of Systems  Constitutional: Negative for  fever.  Gastrointestinal: Positive for abdominal pain. Negative for constipation, diarrhea, nausea and vomiting.  Genitourinary: Negative for dysuria, frequency, urgency, vaginal bleeding and vaginal discharge.   Physical Exam   Blood pressure 117/61, pulse 72, temperature 98.1 F (36.7 C), temperature source Oral, resp. rate 16, weight 119 lb (54 kg), last menstrual period 05/08/2017, SpO2 100 %, currently breastfeeding.  Physical Exam  Nursing note and vitals reviewed. Constitutional: She is oriented to person, place, and time. She appears well-developed and well-nourished. No distress.  HENT:  Head: Normocephalic and atraumatic.  Cardiovascular: Normal rate.  Respiratory: Effort normal.  GI: Soft. She exhibits no distension and no mass. There is no tenderness. There is no rebound and no guarding.  Neurological: She is alert and oriented to person, place, and time.  Skin: Skin is warm and dry. No erythema.  Psychiatric: She has a normal mood and affect.  Cervix: closed, thick, firm   MAU Course  Procedures None  MDM FHR - 172 bpm Cervix closed. Minimal pain.   Assessment and Plan  A: [facility] at [redacted]w[redacted]d  Round ligament pain   P: Discharge home Tylenol PRN for pain advised  First trimester precautions discussed Patient [facility] to [facility] prenatal care  Patient may return to MAU as needed or if her condition were to change or worsen  Vonzella NippleJulie Eniyah Eastmond, PA-C 08/08/2017, 1:25 PM

## 2017-08-08 NOTE — MAU Note (Signed)
Has been having pain in lower abd and left side, usually when she wakes up.  Lasts about an hour or 2 then subsides.  Has been happening for a couple wks.  Has been constipated, had BM yesterday.  Seen in [place] with preg.

## 2018-02-06 ENCOUNTER — Encounter (HOSPITAL_COMMUNITY): Payer: Self-pay | Admitting: *Deleted

## 2018-02-06 ENCOUNTER — Other Ambulatory Visit: Payer: Self-pay

## 2018-02-06 ENCOUNTER — Inpatient Hospital Stay (HOSPITAL_COMMUNITY)
Admission: AD | Admit: 2018-02-06 | Discharge: 2018-02-06 | Disposition: A | Discharge status: Home / Self Care | Payer: Medicaid Other | Source: Ambulatory Visit | Attending: Obstetrics and Gynecology | Admitting: Obstetrics and Gynecology

## 2018-02-06 DIAGNOSIS — O9989 Other specified diseases and conditions complicating pregnancy, childbirth and the puerperium: Secondary | ICD-10-CM

## 2018-02-06 DIAGNOSIS — Z3689 Encounter for other specified antenatal screening: Secondary | ICD-10-CM

## 2018-02-06 DIAGNOSIS — O99019 Anemia complicating pregnancy, unspecified trimester: Secondary | ICD-10-CM

## 2018-02-06 DIAGNOSIS — D649 Anemia, unspecified: Secondary | ICD-10-CM | POA: Diagnosis not present

## 2018-02-06 DIAGNOSIS — N898 Other specified noninflammatory disorders of vagina: Secondary | ICD-10-CM | POA: Diagnosis present

## 2018-02-06 DIAGNOSIS — Z3A35 35 weeks gestation of pregnancy: Secondary | ICD-10-CM | POA: Insufficient documentation

## 2018-02-06 DIAGNOSIS — O99013 Anemia complicating pregnancy, third trimester: Secondary | ICD-10-CM | POA: Diagnosis not present

## 2018-02-06 DIAGNOSIS — R109 Unspecified abdominal pain: Secondary | ICD-10-CM

## 2018-02-06 DIAGNOSIS — Z79899 Other long term (current) drug therapy: Secondary | ICD-10-CM | POA: Insufficient documentation

## 2018-02-06 DIAGNOSIS — Z3490 Encounter for supervision of normal pregnancy, unspecified, unspecified trimester: Secondary | ICD-10-CM

## 2018-02-06 DIAGNOSIS — O26893 Other specified pregnancy related conditions, third trimester: Secondary | ICD-10-CM | POA: Diagnosis present

## 2018-02-06 LAB — CBC
HCT: 29.1 % — ABNORMAL LOW (ref 36.0–46.0)
HEMOGLOBIN: 9.9 g/dL — AB (ref 12.0–15.0)
MCH: 26.8 pg (ref 26.0–34.0)
MCHC: 34 g/dL (ref 30.0–36.0)
MCV: 78.6 fL — ABNORMAL LOW (ref 80.0–100.0)
PLATELETS: 196 10*3/uL (ref 150–400)
RBC: 3.7 MIL/uL — ABNORMAL LOW (ref 3.87–5.11)
RDW: 15.5 % (ref 11.5–15.5)
WBC: 10.1 10*3/uL (ref 4.0–10.5)

## 2018-02-06 LAB — WET PREP, GENITAL
Clue Cells Wet Prep HPF POC: NONE SEEN
SPERM: NONE SEEN
Trich, Wet Prep: NONE SEEN
Yeast Wet Prep HPF POC: NONE SEEN

## 2018-02-06 LAB — POCT FERN TEST: POCT FERN TEST: NEGATIVE

## 2018-02-06 NOTE — Discharge Instructions (Signed)
Research childbirth classes and hospital preregistration at [email or web address]  Fetal Movement Counts Patient Name: ________________________________________________ Patient Due Date: ____________________ What is a fetal movement count? A fetal movement count is the number of times that you feel your baby move during a certain amount of time. This may also be called a fetal kick count. A fetal movement count is recommended for every pregnant woman. You may be asked to start counting fetal movements as early as week 28 of your pregnancy. Pay attention to when your baby is most active. You may notice your baby's sleep and wake cycles. You may also notice things that make your baby move more. You should do a fetal movement count:  When your baby is normally most active.  At the same time each day.  A good time to count movements is while you are resting, after having something to eat and drink. How do I count fetal movements? 1. Find a quiet, comfortable area. Sit, or lie down on your side. 2. Write down the date, the start time and stop time, and the number of movements that you felt between those two times. Take this information with you to your health care visits. 3. For 2 hours, count kicks, flutters, swishes, rolls, and jabs. You should feel at least 10 movements during 2 hours. 4. You may stop counting after you have felt 10 movements. 5. If you do not feel 10 movements in 2 hours, have something to eat and drink. Then, keep resting and counting for 1 hour. If you feel at least 4 movements during that hour, you may stop counting. Contact a health care provider if:  You feel fewer than 4 movements in 2 hours.  Your baby is not moving like he or she usually does. Date: ____________ Start time: ____________ Stop time: ____________ Movements: ____________ Date: ____________ Start time: ____________ Stop time: ____________ Movements: ____________ Date: ____________ Start time: ____________  Stop time: ____________ Movements: ____________ Date: ____________ Start time: ____________ Stop time: ____________ Movements: ____________ Date: ____________ Start time: ____________ Stop time: ____________ Movements: ____________ Date: ____________ Start time: ____________ Stop time: ____________ Movements: ____________ Date: ____________ Start time: ____________ Stop time: ____________ Movements: ____________ Date: ____________ Start time: ____________ Stop time: ____________ Movements: ____________ Date: ____________ Start time: ____________ Stop time: ____________ Movements: ____________ This information is not intended to replace advice given to you by your health care provider. Make sure you discuss any questions you have with your health care provider. Document Released: 05/04/2006 Document Revised: 12/02/2015 Document Reviewed: 05/14/2015 Elsevier Interactive Patient Education  2018 [employer] Inc[employer]  Braxton Hicks Contractions Contractions of the uterus can occur throughout pregnancy, but they are not always a sign that you are in labor. You may have practice contractions called Braxton Hicks contractions. These false labor contractions are sometimes confused with true labor. What are Braxton Hicks contractions? Braxton Hicks contractions are tightening movements that occur in the muscles of the uterus before labor. Unlike true labor contractions, these contractions do not result in opening (dilation) and thinning of the cervix. Toward the end of pregnancy (32-34 weeks), Braxton Hicks contractions can happen more often and may become stronger. These contractions are sometimes difficult to tell apart from true labor because they can be very uncomfortable. You should not feel embarrassed if you go to the hospital with false labor. Sometimes, the only way to tell if you are in true labor is for your health care provider to look for changes in the cervix. The health care provider will   do a physical  exam and may monitor your contractions. If you are not in true labor, the exam should show that your cervix is not dilating and your water has not broken. If there are other health problems associated with your pregnancy, it is completely safe for you to be sent home with false labor. You may continue to have Braxton Hicks contractions until you go into true labor. How to tell the difference between true labor and false labor True labor  Contractions last 30-70 seconds.  Contractions become very regular.  Discomfort is usually felt in the top of the uterus, and it spreads to the lower abdomen and low back.  Contractions do not go away with walking.  Contractions usually become more intense and increase in frequency.  The cervix dilates and gets thinner. False labor  Contractions are usually shorter and not as strong as true labor contractions.  Contractions are usually irregular.  Contractions are often felt in the front of the lower abdomen and in the groin.  Contractions may go away when you walk around or change positions while lying down.  Contractions get weaker and are shorter-lasting as time goes on.  The cervix usually does not dilate or become thin. Follow these instructions at home:  Take over-the-counter and prescription medicines only as told by your health care provider.  Keep up with your usual exercises and follow other instructions from your health care provider.  Eat and drink lightly if you think you are going into labor.  If Braxton Hicks contractions are making you uncomfortable: ? Change your position from lying down or resting to walking, or change from walking to resting. ? Sit and rest in a tub of warm water. ? Drink enough fluid to keep your urine pale yellow. Dehydration may cause these contractions. ? Do slow and deep breathing several times an hour.  Keep all follow-up prenatal visits as told by your health care provider. This is  important. Contact a health care provider if:  You have a fever.  You have continuous pain in your abdomen. Get help right away if:  Your contractions become stronger, more regular, and closer together.  You have fluid leaking or gushing from your vagina.  You pass blood-tinged mucus (bloody show).  You have bleeding from your vagina.  You have low back pain that you never had before.  You feel your baby's head pushing down and causing pelvic pressure.  Your baby is not moving inside you as much as it used to. Summary  Contractions that occur before labor are called Braxton Hicks contractions, false labor, or practice contractions.  Braxton Hicks contractions are usually shorter, weaker, farther apart, and less regular than true labor contractions. True labor contractions usually become progressively stronger and regular and they become more frequent.  Manage discomfort from Braxton Hicks contractions by changing position, resting in a warm bath, drinking plenty of water, or practicing deep breathing. This information is not intended to replace advice given to you by your health care provider. Make sure you discuss any questions you have with your health care provider. Document Released: 08/18/2016 Document Revised: 08/18/2016 Document Reviewed: 08/18/2016 Elsevier Interactive Patient Education  2018 [employer] Inc.    

## 2018-02-06 NOTE — MAU Note (Signed)
Urine sent to lab 

## 2018-02-06 NOTE — MAU Provider Note (Signed)
History     CSN: [id number]  Arrival date and time: 02/06/18 1021   First Provider Initiated Contact with Patient 02/06/18 1119      Chief Complaint  Patient presents with  . Abdominal Pain  . Rupture of Membranes   HPI Angela Acosta is a 22 y.o. G2P1001 at [redacted]w[redacted]d who presents to MAU with concern from [facility] at approximately 0930 this morning. Patient states she was standing at work when she felt a trickle of fluid down her legs. Denies large gush, continuous leaking, vaginal bleeding, decreased fetal movement, fever, falls, or recent illness.    Patient states she was diagnosed with Anemia and has been taking BID Iron supplements for the past week.  OB History    Gravida  2   Para  1   Term  1   Preterm      AB      Living  1     SAB      TAB      Ectopic      Multiple  0   Live Births  1           Past Medical History:  Diagnosis Date  . Anemia   . Heart murmur   . Panic disorder     Past Surgical History:  Procedure Laterality Date  . NO PAST SURGERIES      Family History  Problem Relation Age of Onset  . Hypertension Father   . Cancer Paternal Grandmother     Social History   Tobacco Use  . Smoking status: Never Smoker  . Smokeless tobacco: Never Used  Substance Use Topics  . Alcohol use: Yes    Comment: rare  . Drug use: No    Allergies: No Known Allergies  Medications Prior to Admission  Medication Sig Dispense Refill Last Dose  . albuterol (PROVENTIL HFA;VENTOLIN HFA) 108 (90 Base) MCG/ACT inhaler Inhale 2 puffs into the lungs every 4 (four) hours as needed for wheezing or shortness of breath (cough, shortness of breath or wheezing.). 1 Inhaler 1 Taking  . escitalopram (LEXAPRO) 10 MG tablet Take 1 tablet (10 mg total) by mouth at bedtime. 60 tablet 1 Taking  . hydrOXYzine (ATARAX/VISTARIL) 25 MG tablet Take 0.5-1 tablets (12.5-25 mg total) by mouth every 8 (eight) hours as needed for itching. 30 tablet 1 Taking  . ranitidine  (ZANTAC) 150 MG tablet Take 1 tablet (150 mg total) by mouth 2 (two) times daily. 60 tablet 0 Taking    Review of Systems  Constitutional: Negative for chills, fatigue and fever.  Gastrointestinal: Negative for abdominal pain, nausea and vomiting.       Pt states she feel abdominal pressure, denies pain  Genitourinary: Negative for difficulty urinating, dyspareunia, dysuria and flank pain.  Musculoskeletal: Negative for back pain.  Neurological: Negative for headaches.  All other systems reviewed and are negative.  Physical Exam   Blood pressure (!) 108/50, pulse 78, temperature 98.2 F (36.8 C), temperature source Oral, resp. rate 16, weight 67.4 kg, last menstrual period 05/08/2017, SpO2 100 %, currently breastfeeding.  Physical Exam  Nursing note and vitals reviewed. Constitutional: She is oriented to person, place, and time. She appears well-developed and well-nourished.  Cardiovascular: Normal rate and intact distal pulses.  Respiratory: Effort normal. No respiratory distress.  GI: Soft. She exhibits no distension. There is no tenderness. There is no rebound, no guarding and no CVA tenderness.  Gravid  Genitourinary: Uterus normal. Cervix exhibits discharge. Cervix exhibits  no motion tenderness and no friability. Vaginal discharge found.  Genitourinary Comments: Thick white discharge visible near cervical os on SSE Negative pooling  Neurological: She is alert and oriented to person, place, and time. She has normal reflexes.  Skin: Skin is warm.  Psychiatric: She has a normal mood and affect. Her behavior is normal. Judgment and thought content normal.    MAU Course  Procedures  MDM --Negative pooling --Negative Fern --Closed cervix --Reactive fetal tracing: baseline 135, moderate variability, positive accelerations, no decelerations --Toco: occasional contractions, not felt by patient, palpate mild  Patient Vitals for the past 24 hrs:  BP Temp Temp src Pulse Resp SpO2  Weight  02/06/18 1053 (!) 108/50 98.2 F (36.8 C) Oral 78 16 100 % 67.4 kg    Orders Placed This Encounter  Procedures  . Wet prep, genital    Standing Status:   Standing    Number of Occurrences:   1  . CBC    Standing Status:   Standing    Number of Occurrences:   1  . Fern Test    Standing Status:   Standing    Number of Occurrences:   1   Results for orders placed or performed during the hospital encounter of 02/06/18 (from the past 24 hour(s))  Wet prep, genital     Status: Abnormal   Collection Time: 02/06/18 11:30 AM  Result Value Ref Range   Yeast Wet Prep HPF POC NONE SEEN NONE SEEN   Trich, Wet Prep NONE SEEN NONE SEEN   Clue Cells Wet Prep HPF POC NONE SEEN NONE SEEN   WBC, Wet Prep HPF POC MANY (A) NONE SEEN   Sperm NONE SEEN   CBC     Status: Abnormal   Collection Time: 02/06/18 11:35 AM  Result Value Ref Range   WBC 10.1 4.0 - 10.5 K/uL   RBC 3.70 (L) 3.87 - 5.11 MIL/uL   Hemoglobin 9.9 (L) 12.0 - 15.0 g/dL   HCT 40.9 (L) 81.1 - 91.4 %   MCV 78.6 (L) 80.0 - 100.0 fL   MCH 26.8 26.0 - 34.0 pg   MCHC 34.0 30.0 - 36.0 g/dL   RDW 78.2 95.6 - 21.3 %   Platelets 196 150 - 400 K/uL  Fern Test     Status: None   Collection Time: 02/06/18 11:49 AM  Result Value Ref Range   POCT Fern Test Negative = intact amniotic membranes      Assessment and Plan  --22 y.o. G2P1001 at [redacted]w[redacted]d  --Reactive fetal tracing --Intact amniotic sac --Anemia: continue BID iron supplement --Probable round ligament pain, consider maternity belt --Discharge home in stable condition  F/U: Patient has OB appt 02/08/18  Calvert Cantor, CNM 02/06/2018, 12:29 PM

## 2018-02-06 NOTE — MAU Note (Signed)
Been having some vaginal leaking, started about 0800, clear fluid.  Been having some braxton hicks contractions.  When at work, was standing, got overheated, felt like she was going to pass out.  Sat down, feeling passed.  Having some sharp pains on left side.

## 2018-02-26 ENCOUNTER — Encounter (HOSPITAL_COMMUNITY): Payer: Self-pay

## 2018-02-26 ENCOUNTER — Inpatient Hospital Stay (HOSPITAL_COMMUNITY)
Admission: AD | Admit: 2018-02-26 | Discharge: 2018-02-26 | Disposition: A | Discharge status: Home / Self Care | Payer: Medicaid Other | Source: Ambulatory Visit | Attending: Obstetrics and Gynecology | Admitting: Obstetrics and Gynecology

## 2018-02-26 DIAGNOSIS — O479 False labor, unspecified: Secondary | ICD-10-CM | POA: Diagnosis not present

## 2018-02-26 DIAGNOSIS — Z3A38 38 weeks gestation of pregnancy: Secondary | ICD-10-CM | POA: Diagnosis not present

## 2018-02-26 DIAGNOSIS — Z0371 Encounter for suspected problem with amniotic cavity and membrane ruled out: Secondary | ICD-10-CM | POA: Diagnosis not present

## 2018-02-26 DIAGNOSIS — O471 False labor at or after 37 completed weeks of gestation: Secondary | ICD-10-CM

## 2018-02-26 LAB — POCT FERN TEST: POCT Fern Test: NEGATIVE

## 2018-02-26 NOTE — MAU Note (Addendum)
Woke up with a sharp pain that comes and goes since 0000.  Pain occurs every 4 minutes.  May be having some leaking of fluid?  Reports no complications with the pregnancy.  Was 2 cm on last VE.   + FM.  No VB.

## 2018-02-26 NOTE — Discharge Instructions (Signed)

## 2018-02-26 NOTE — MAU Provider Note (Signed)
First Provider Initiated Contact with Patient 02/26/18 0133       S: [name] is a 22 y.o. G2P1001 at [redacted]w[redacted]d  who presents to MAU today complaining of leaking of fluid since this evening; unsure if it has continued. She denies vaginal bleeding. She endorses contractions. She reports normal fetal movement.    O: BP 105/66 (BP Location: Right Arm)   Pulse 80   Temp 97.9 F (36.6 C)   Resp 17   Ht 5\' 6"  (1.676 m)   Wt 69.5 kg   LMP 05/08/2017 (Approximate)   BMI 24.72 kg/m  GENERAL: Well-developed, well-nourished female in no acute distress.  HEAD: Normocephalic, atraumatic.  CHEST: Normal effort of breathing, regular heart rate ABDOMEN: Soft, nontender, gravid PELVIC: Normal external female genitalia. Vagina is pink and rugated. Cervix with normal contour, no lesions. Normal discharge.  No pooling.   Cervical exam:  Dilation: 1.5 Effacement (%): Thick Cervical Position: Posterior Station: -3 Presentation: Vertex Exam by:: Judeth Horn, NP   Fetal Monitoring: Baseline: 130 Variability: moderate Accelerations: 15x15 Decelerations: none Contractions: Q5-8 mins  Results for orders placed or performed during the hospital encounter of 02/26/18 (from the past 24 hour(s))  POCT fern test     Status: None   Collection Time: 02/26/18  1:45 AM  Result Value Ref Range   POCT Fern Test Negative = intact amniotic membranes      A: 1. False labor  -SVE unchanged after 1+ hrs of monitoring & pt reports pain has decreased  2. [redacted] weeks gestation of pregnancy   3. Encounter for suspected PROM, with rupture of membranes not found  -no pooling, fern negative     P: Discharge home in stable condition. Discussed reasons to return to MAU including labor, SROM, or DFM. Keep scheduled f/u in office on Thursday.   Judeth Horn, NP 02/26/2018 3:04 AM

## 2018-03-01 ENCOUNTER — Encounter (HOSPITAL_COMMUNITY): Payer: Self-pay

## 2018-03-01 ENCOUNTER — Other Ambulatory Visit: Payer: Self-pay

## 2018-03-01 ENCOUNTER — Inpatient Hospital Stay (EMERGENCY_DEPARTMENT_HOSPITAL)
Admission: AD | Admit: 2018-03-01 | Discharge: 2018-03-01 | Disposition: A | Discharge status: Home / Self Care | Payer: Medicaid Other | Source: Ambulatory Visit | Attending: Obstetrics and Gynecology | Admitting: Obstetrics and Gynecology

## 2018-03-01 DIAGNOSIS — Z3A39 39 weeks gestation of pregnancy: Secondary | ICD-10-CM | POA: Insufficient documentation

## 2018-03-01 DIAGNOSIS — R109 Unspecified abdominal pain: Secondary | ICD-10-CM | POA: Insufficient documentation

## 2018-03-01 DIAGNOSIS — O479 False labor, unspecified: Secondary | ICD-10-CM | POA: Diagnosis not present

## 2018-03-01 DIAGNOSIS — O26893 Other specified pregnancy related conditions, third trimester: Secondary | ICD-10-CM

## 2018-03-01 LAB — POCT FERN TEST: POCT Fern Test: NEGATIVE

## 2018-03-01 LAB — AMNISURE RUPTURE OF MEMBRANE (ROM) NOT AT ARMC: Amnisure ROM: NEGATIVE

## 2018-03-01 NOTE — MAU Provider Note (Signed)
S: [name] is a 22 y.o. G2P1001 at 5760w0d  who presents to MAU today complaining of leaking of fluid since morning of 03/01/18. She denies vaginal bleeding. She endorses contractions. She reports normal fetal movement.    O: BP 113/62   Pulse 89   Temp 98.5 F (36.9 C)   Resp 18   Ht 5\' 6"  (1.676 m)   Wt 69.4 kg   LMP 05/08/2017 (Approximate)   SpO2 100%   BMI 24.69 kg/m  GENERAL: Well-developed, well-nourished female in no acute distress.  HEAD: Normocephalic, atraumatic.  CHEST: Normal effort of breathing, regular heart rate ABDOMEN: Soft, nontender, gravid PELVIC: Normal external female genitalia. Vagina is pink and rugated. Cervix with normal contour, no lesions. Normal discharge.  negative pooling.   Cervical exam:  Dilation: 1.5 Effacement (%): 60 Cervical Position: Posterior Station: -2 Presentation: Vertex Exam by:: Zenia ResidesNikki Risheq, RN    Fetal Monitoring: Baseline: 135 Variability: moderate Accelerations: present Decelerations: none Contractions: Q 3-5, moderate to palpation  Results for orders placed or performed during the hospital encounter of 03/01/18 (from the past 24 hour(s))  Am[facility] rupture [facility] membrane (rom)[facility]     Status: None   Collection Time: 03/01/18  5:08 PM  Result Value Ref Range   Amnisure ROM NEGATIVE   Fern Test     Status: None   Collection Time: 03/01/18  6:06 PM  Result Value Ref Range   POCT Fern Test Negative = intact amniotic membranes      A: SIUP at 7060w0d  Membranes intact  Cramping/contractions but no evidence of active labor  P: D/C home with labor precautions  Hurshel PartyLeftwich-Kirby, Lisa A, CNM 03/01/2018 6:12 PM

## 2018-03-01 NOTE — Discharge Instructions (Signed)

## 2018-03-01 NOTE — MAU Note (Signed)
Pt reports ctx x 2 hrs about 5-7 min apart.Pt report some clear vaginal discharge denies  Any vag bleeding . Good fetal movement reported. 3cm on last cervical check

## 2018-03-02 ENCOUNTER — Encounter (HOSPITAL_COMMUNITY): Payer: Self-pay | Admitting: *Deleted

## 2018-03-02 ENCOUNTER — Inpatient Hospital Stay (HOSPITAL_COMMUNITY): Payer: Medicaid Other | Admitting: Anesthesiology

## 2018-03-02 ENCOUNTER — Inpatient Hospital Stay (HOSPITAL_COMMUNITY)
Admission: AD | Admit: 2018-03-02 | Discharge: 2018-03-04 | DRG: 807 | Disposition: A | Discharge status: Home / Self Care | Payer: Medicaid Other | Attending: Obstetrics and Gynecology | Admitting: Obstetrics and Gynecology

## 2018-03-02 ENCOUNTER — Other Ambulatory Visit: Payer: Self-pay

## 2018-03-02 DIAGNOSIS — D649 Anemia, unspecified: Secondary | ICD-10-CM | POA: Diagnosis present

## 2018-03-02 DIAGNOSIS — O9902 Anemia complicating childbirth: Principal | ICD-10-CM | POA: Diagnosis present

## 2018-03-02 DIAGNOSIS — R109 Unspecified abdominal pain: Secondary | ICD-10-CM | POA: Diagnosis not present

## 2018-03-02 DIAGNOSIS — Z3483 Encounter for supervision of other normal pregnancy, third trimester: Secondary | ICD-10-CM | POA: Diagnosis present

## 2018-03-02 DIAGNOSIS — Z3A39 39 weeks gestation of pregnancy: Secondary | ICD-10-CM | POA: Diagnosis not present

## 2018-03-02 DIAGNOSIS — O26893 Other specified pregnancy related conditions, third trimester: Secondary | ICD-10-CM | POA: Diagnosis not present

## 2018-03-02 DIAGNOSIS — Z3490 Encounter for supervision of normal pregnancy, unspecified, unspecified trimester: Secondary | ICD-10-CM

## 2018-03-02 LAB — CBC
HCT: 33.3 % — ABNORMAL LOW (ref 36.0–46.0)
Hemoglobin: 10.8 g/dL — ABNORMAL LOW (ref 12.0–15.0)
MCH: 26.6 pg (ref 26.0–34.0)
MCHC: 32.4 g/dL (ref 30.0–36.0)
MCV: 82 fL (ref 80.0–100.0)
NRBC: 0.1 % (ref 0.0–0.2)
PLATELETS: 199 10*3/uL (ref 150–400)
RBC: 4.06 MIL/uL (ref 3.87–5.11)
RDW: 15.5 % (ref 11.5–15.5)
WBC: 13.8 10*3/uL — AB (ref 4.0–10.5)

## 2018-03-02 LAB — TYPE AND SCREEN
ABO/RH(D): O POS
ANTIBODY SCREEN: NEGATIVE

## 2018-03-02 MED ORDER — TETANUS-DIPHTH-ACELL PERTUSSIS 5-2.5-18.5 LF-MCG/0.5 IM SUSP: 0.5000 mL | Freq: Once | INTRAMUSCULAR | Status: DC

## 2018-03-02 MED ORDER — OXYCODONE-ACETAMINOPHEN 5-325 MG PO TABS: 1.0000 | ORAL_TABLET | ORAL | Status: DC | PRN

## 2018-03-02 MED ORDER — LIDOCAINE HCL (PF) 1 % IJ SOLN
30.0000 mL | INTRAMUSCULAR | Status: DC | PRN
  Filled 2018-03-02: qty 30

## 2018-03-02 MED ORDER — LACTATED RINGERS AMNIOINFUSION
INTRAVENOUS | Status: DC
  Administered 2018-03-02: 15:00:00 via INTRAUTERINE
  Filled 2018-03-02: qty 1000

## 2018-03-02 MED ORDER — EPHEDRINE 5 MG/ML INJ
10.0000 mg | INTRAVENOUS | Status: DC | PRN
  Filled 2018-03-02: qty 2

## 2018-03-02 MED ORDER — DIPHENHYDRAMINE HCL 50 MG/ML IJ SOLN: 12.5000 mg | INTRAMUSCULAR | Status: DC | PRN

## 2018-03-02 MED ORDER — ONDANSETRON HCL 4 MG/2ML IJ SOLN: 4.0000 mg | INTRAMUSCULAR | Status: DC | PRN

## 2018-03-02 MED ORDER — OXYTOCIN BOLUS FROM INFUSION
500.0000 mL | Freq: Once | INTRAVENOUS | Status: AC
  Administered 2018-03-02: 500 mL via INTRAVENOUS

## 2018-03-02 MED ORDER — SOD CITRATE-CITRIC ACID 500-334 MG/5ML PO SOLN: 30.0000 mL | ORAL | Status: DC | PRN

## 2018-03-02 MED ORDER — ACETAMINOPHEN 325 MG PO TABS: 650.0000 mg | ORAL_TABLET | ORAL | Status: DC | PRN

## 2018-03-02 MED ORDER — PHENYLEPHRINE 40 MCG/ML (10ML) SYRINGE FOR IV PUSH (FOR BLOOD PRESSURE SUPPORT)
80.0000 ug | PREFILLED_SYRINGE | INTRAVENOUS | Status: DC | PRN
  Filled 2018-03-02: qty 5
  Filled 2018-03-02: qty 10

## 2018-03-02 MED ORDER — FENTANYL 2.5 MCG/ML BUPIVACAINE 1/10 % EPIDURAL INFUSION (WH - ANES)
14.0000 mL/h | INTRAMUSCULAR | Status: DC | PRN
  Administered 2018-03-02: 14 mL/h via EPIDURAL
  Filled 2018-03-02: qty 100

## 2018-03-02 MED ORDER — ZOLPIDEM TARTRATE 5 MG PO TABS: 5.0000 mg | ORAL_TABLET | Freq: Every evening | ORAL | Status: DC | PRN

## 2018-03-02 MED ORDER — NALBUPHINE HCL 10 MG/ML IJ SOLN: 5.0000 mg | INTRAMUSCULAR | Status: DC | PRN

## 2018-03-02 MED ORDER — BENZOCAINE-MENTHOL 20-0.5 % EX AERO
1.0000 "application " | INHALATION_SPRAY | CUTANEOUS | Status: DC | PRN
  Administered 2018-03-02: 1 via TOPICAL
  Filled 2018-03-02: qty 56

## 2018-03-02 MED ORDER — OXYCODONE-ACETAMINOPHEN 5-325 MG PO TABS: 2.0000 | ORAL_TABLET | ORAL | Status: DC | PRN

## 2018-03-02 MED ORDER — SIMETHICONE 80 MG PO CHEW: 80.0000 mg | CHEWABLE_TABLET | ORAL | Status: DC | PRN

## 2018-03-02 MED ORDER — NALBUPHINE HCL 10 MG/ML IJ SOLN: 5.0000 mg | Freq: Once | INTRAMUSCULAR | Status: DC | PRN

## 2018-03-02 MED ORDER — SENNOSIDES-DOCUSATE SODIUM 8.6-50 MG PO TABS
2.0000 | ORAL_TABLET | ORAL | Status: DC
  Administered 2018-03-03 – 2018-03-04 (×2): 2 via ORAL
  Filled 2018-03-02 (×2): qty 2

## 2018-03-02 MED ORDER — LACTATED RINGERS IV SOLN
500.0000 mL | Freq: Once | INTRAVENOUS | Status: AC
  Administered 2018-03-02: 500 mL via INTRAVENOUS

## 2018-03-02 MED ORDER — LACTATED RINGERS IV SOLN
INTRAVENOUS | Status: DC
  Administered 2018-03-02 (×2): via INTRAVENOUS

## 2018-03-02 MED ORDER — PHENYLEPHRINE 40 MCG/ML (10ML) SYRINGE FOR IV PUSH (FOR BLOOD PRESSURE SUPPORT)
80.0000 ug | PREFILLED_SYRINGE | INTRAVENOUS | Status: DC | PRN
  Filled 2018-03-02: qty 5

## 2018-03-02 MED ORDER — ALBUTEROL SULFATE (2.5 MG/3ML) 0.083% IN NEBU: 3.0000 mL | INHALATION_SOLUTION | RESPIRATORY_TRACT | Status: DC | PRN

## 2018-03-02 MED ORDER — DIBUCAINE 1 % RE OINT: 1.0000 "application " | TOPICAL_OINTMENT | RECTAL | Status: DC | PRN

## 2018-03-02 MED ORDER — LACTATED RINGERS IV SOLN: 500.0000 mL | INTRAVENOUS | Status: DC | PRN

## 2018-03-02 MED ORDER — IBUPROFEN 600 MG PO TABS
600.0000 mg | ORAL_TABLET | Freq: Four times a day (QID) | ORAL | Status: DC
  Administered 2018-03-02 – 2018-03-04 (×7): 600 mg via ORAL
  Filled 2018-03-02 (×7): qty 1

## 2018-03-02 MED ORDER — NALOXONE HCL 4 MG/10ML IJ SOLN: 1.0000 ug/kg/h | INTRAVENOUS | Status: DC | PRN

## 2018-03-02 MED ORDER — MEASLES, MUMPS & RUBELLA VAC IJ SOLR: 0.5000 mL | Freq: Once | INTRAMUSCULAR | Status: DC

## 2018-03-02 MED ORDER — WITCH HAZEL-GLYCERIN EX PADS: 1.0000 "application " | MEDICATED_PAD | CUTANEOUS | Status: DC | PRN

## 2018-03-02 MED ORDER — OXYTOCIN 40 UNITS IN LACTATED RINGERS INFUSION - SIMPLE MED
2.5000 [IU]/h | INTRAVENOUS | Status: DC
  Filled 2018-03-02: qty 1000

## 2018-03-02 MED ORDER — DIPHENHYDRAMINE HCL 25 MG PO CAPS: 25.0000 mg | ORAL_CAPSULE | ORAL | Status: DC | PRN

## 2018-03-02 MED ORDER — ONDANSETRON HCL 4 MG PO TABS: 4.0000 mg | ORAL_TABLET | ORAL | Status: DC | PRN

## 2018-03-02 MED ORDER — SCOPOLAMINE 1 MG/3DAYS TD PT72
1.0000 | MEDICATED_PATCH | Freq: Once | TRANSDERMAL | Status: DC
  Administered 2018-03-02: 1.5 mg via TRANSDERMAL
  Filled 2018-03-02: qty 1

## 2018-03-02 MED ORDER — LIDOCAINE HCL (PF) 1 % IJ SOLN
INTRAMUSCULAR | Status: DC | PRN
  Administered 2018-03-02: 8 mL via EPIDURAL

## 2018-03-02 MED ORDER — ONDANSETRON HCL 4 MG/2ML IJ SOLN: 4.0000 mg | Freq: Four times a day (QID) | INTRAMUSCULAR | Status: DC | PRN

## 2018-03-02 MED ORDER — COCONUT OIL OIL: 1.0000 "application " | TOPICAL_OIL | Status: DC | PRN

## 2018-03-02 NOTE — MAU Note (Signed)
Presents with ctxs that began approximately 1 hour ago.  Denies VB or LOF.  Reports +FM.

## 2018-03-02 NOTE — Progress Notes (Signed)
Pt to ambulate for 1 hour.  Instructed to remain on the 1st floor and return @ 1110 or sooner if SROM or bright VB.  Pt verbalized understanding & agreeable.

## 2018-03-02 NOTE — MAU Provider Note (Addendum)
S: [name] is a 22 y.o. G2P1001 at 329w1d  who presents to MAU today complaining contractions q 5 minutes since 8 am.  She denies vaginal bleeding. She denies LOF. She reports normal fetal movement.    O: BP 116/72 (BP Location: Left Arm)   Pulse 61   Temp (!) 97 F (36.1 C) (Axillary)   Resp 20   LMP 05/08/2017 (Approximate)   SpO2 100%  GENERAL: Well-developed, well-nourished female in no acute distress.  HEAD: Normocephalic, atraumatic.  CHEST: Normal effort of breathing, regular heart rate ABDOMEN: Soft, nontender, gravid  Cervical exam:  Dilation: 4 Effacement (%): 80 Station: -2 Presentation: Vertex Exam by:: F. Morris, RNC   Fetal Monitoring: Baseline: 130 Variability: mod Accelerations: present Decelerations: absent Contractions: q 4   A: SIUP at 529w1d  Active labor based on cervical change over the past 3 hours.   P: [facility], CNM placed admission orders. Patient will wait in MAU until Dareen Pianonderson can be notified.    Angela Acosta, Angela Acosta, CNM 03/02/2018 12:03 PM

## 2018-03-02 NOTE — Anesthesia Procedure Notes (Signed)
Epidural Patient location during procedure: OB Start time: 03/02/2018 1:45 PM End time: 03/02/2018 1:50 PM  Preanesthetic Checklist Completed: patient identified, site marked, surgical consent, pre-op evaluation, timeout performed, IV checked, risks and benefits discussed and monitors and equipment checked  Epidural Patient position: sitting Prep: site prepped and draped and DuraPrep Patient monitoring: continuous pulse ox and blood pressure Approach: midline Location: L3-L4 Injection technique: LOR air  Needle:  Needle type: Tuohy  Needle gauge: 17 G Needle length: 9 cm and 9 Needle insertion depth: 5 cm cm Catheter type: closed end flexible Catheter size: 19 Gauge Catheter at skin depth: 10 cm Test dose: negative  Assessment Events: blood not aspirated, injection not painful, no injection resistance, negative IV test and no paresthesia

## 2018-03-02 NOTE — Anesthesia Preprocedure Evaluation (Signed)
Anesthesia Evaluation  Patient identified by MRN, date of birth, ID band Patient awake    Reviewed: Allergy & Precautions, H&P , Patient's Chart, lab work & pertinent test results  Airway Mallampati: III  TM Distance: >3 FB Neck ROM: full    Dental no notable dental hx. (+) Teeth Intact   Pulmonary neg pulmonary ROS,    Pulmonary exam normal breath sounds clear to auscultation       Cardiovascular negative cardio ROS Normal cardiovascular exam+ Valvular Problems/Murmurs  Rhythm:regular Rate:Normal     Neuro/Psych negative neurological ROS  negative psych ROS   GI/Hepatic negative GI ROS, Neg liver ROS,   Endo/Other  negative endocrine ROS  Renal/GU negative Renal ROS  negative genitourinary   Musculoskeletal   Abdominal   Peds  Hematology  (+) Blood dyscrasia, anemia ,   Anesthesia Other Findings   Reproductive/Obstetrics (+) Pregnancy                             Anesthesia Physical  Anesthesia Plan  ASA: II  Anesthesia Plan: Epidural   Post-op Pain Management:    Induction:   PONV Risk Score and Plan:   Airway Management Planned:   Additional Equipment:   Intra-op Plan:   Post-operative Plan:   Informed Consent: I have reviewed the patients History and Physical, chart, labs and discussed the procedure including the risks, benefits and alternatives for the proposed anesthesia with the patient or authorized representative who has indicated his/her understanding and acceptance.     Plan Discussed with: Anesthesiologist  Anesthesia Plan Comments:         Anesthesia Quick Evaluation

## 2018-03-02 NOTE — Progress Notes (Signed)
[name] is a 1534w1d G2P1001 at 6234w1d who presents to MAU today with complaint of contractions q 5 since 0600. She denies vaginal bleeding. She denies LOF . Her GBS status is negative. She desires epidural.   BP 116/72 (BP Location: Left Arm)   Pulse 61   Temp (!) 97 F (36.1 C) (Axillary)   Resp 20   LMP 05/08/2017 (Approximate)   SpO2 100%  Dilation: 4 Effacement (%): 80 Station: -2 Presentation: Vertex Exam by:: Doreatha MassedF. Jceon Alverio, RNC  Report called to on-call MD for admission Verbal orders taken Patient transported to L&D

## 2018-03-02 NOTE — H&P (Signed)
Angela Acosta is an 22 y.o. G2P1001 23 [redacted]w[redacted]d black female who presents to the hospital in labor. PNC was uncomplicated. She is beta thal minor. FOB NEVER tested, despite multiple times being advised to do this. She had a nl FTS/OGTT. GBS-negative.  Chief Complaint: HPI:  Past Medical History:  Diagnosis Date  . Anemia   . Heart murmur   . Panic disorder     Past Surgical History:  Procedure Laterality Date  . NO PAST SURGERIES      Family History  Problem Relation Age of Onset  . Hypertension Father   . Cancer Paternal Grandmother    Social History:  reports that she has never smoked. She has never used smokeless tobacco. She reports that she drinks alcohol. She reports that she does not use drugs.  Allergies: No Known Allergies  Medications Prior to Admission  Medication Sig Dispense Refill  . IRON PO Take 1 tablet by mouth 2 (two) times daily.    . Prenatal Vit-Fe Fumarate-FA (PRENATAL MULTIVITAMIN) TABS tablet Take 1 tablet by mouth daily at 12 noon.    Marland Kitchen. albuterol (PROVENTIL HFA;VENTOLIN HFA) 108 (90 Base) MCG/ACT inhaler Inhale 2 puffs into the lungs every 4 (four) hours as needed for wheezing or shortness of breath (cough, shortness of breath or wheezing.). 1 Inhaler 1       Blood pressure 115/60, pulse 67, temperature 97.8 F (36.6 C), temperature source Oral, resp. rate 16, height 5\' 7"  (1.702 m), weight 69.4 kg, last menstrual period 05/08/2017, SpO2 100 %, currently breastfeeding. General appearance: alert and cooperative Abdomen: gravid. non tender   Lab Results  Component Value Date   WBC 13.8 (H) 03/02/2018   HGB 10.8 (L) 03/02/2018   HCT 33.3 (L) 03/02/2018   MCV 82.0 03/02/2018   PLT 199 03/02/2018   Lab Results  Component Value Date   PREGTESTUR NEGATIVE 05/09/2017   HCG <5.0 12/27/2015      Patient Active Problem List   Diagnosis Date Noted  . 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

## 2018-03-03 LAB — CBC
HEMATOCRIT: 26 % — AB (ref 36.0–46.0)
HEMOGLOBIN: 8.7 g/dL — AB (ref 12.0–15.0)
MCH: 27.2 pg (ref 26.0–34.0)
MCHC: 33.5 g/dL (ref 30.0–36.0)
MCV: 81.3 fL (ref 80.0–100.0)
Platelets: 169 10*3/uL (ref 150–400)
RBC: 3.2 MIL/uL — ABNORMAL LOW (ref 3.87–5.11)
RDW: 15.5 % (ref 11.5–15.5)
WBC: 14.2 10*3/uL — AB (ref 4.0–10.5)
nRBC: 0 % (ref 0.0–0.2)

## 2018-03-03 LAB — RPR: RPR Ser Ql: NONREACTIVE

## 2018-03-03 MED ORDER — FERROUS SULFATE 325 (65 FE) MG PO TABS
325.0000 mg | ORAL_TABLET | Freq: Every day | ORAL | Status: DC
  Administered 2018-03-04: 325 mg via ORAL
  Filled 2018-03-03: qty 1

## 2018-03-03 NOTE — Progress Notes (Signed)
MOB was referred for history of depression/anxiety. * Referral screened out by Clinical Social Worker because none of the following criteria appear to apply: ~ History of anxiety/depression during this pregnancy, or of post-partum depression following prior delivery. ~ Diagnosis of anxiety and/or depression within last 3 years OR * MOB's symptoms currently being treated with medication and/or therapy. Please contact the Clinical Social Worker if needs arise, by MOB request, or if MOB scores greater than 9/yes to question 10 on Edinburgh Postpartum Depression Screen.  Angela Sandy, LCSW Clinical Social Worker  [facility]  [phone]  

## 2018-03-03 NOTE — Anesthesia Postprocedure Evaluation (Signed)
Anesthesia Post Note  Patient: Angela Acosta  Procedure(s) Performed: AN AD HOC LABOR EPIDURAL     Patient location during evaluation: Mother Baby Anesthesia Type: Epidural Level of consciousness: awake and alert Pain management: pain level controlled Vital Signs Assessment: post-procedure vital signs reviewed and stable Respiratory status: spontaneous breathing Cardiovascular status: stable Postop Assessment: no headache, patient able to bend at knees, no backache, no apparent nausea or vomiting, epidural receding, adequate PO intake and able to ambulate Anesthetic complications: no    Last Vitals:  Vitals:   03/03/18 0145 03/03/18 0535  BP: 111/64 106/69  Pulse: 66 66  Resp: 16 16  Temp: 36.9 C 36.7 C  SpO2: 100% 100%    Last Pain:  Vitals:   03/03/18 0535  TempSrc: Oral  PainSc: 0-No pain   Pain Goal:                 [name], [name]

## 2018-03-03 NOTE — Progress Notes (Signed)
PPD#1 Pt without complaints. Lochia mild VSSAF  hgb-8.7 IMP/ Stable Plan/ Routine care

## 2018-03-03 NOTE — Lactation Note (Signed)
This note was copied from a baby's chart. Lactation Consultation Note:   P 2, mother that reports breastfeeding her first child for 2 yrs.  Mother reports that infant is feeding well.  She reports that she has a tiny crack on the Lt nipple and observed a small amt of bleeding, due to infant suckling so hard.  Assist mother with hand expression, observed large amts of colostrum.  Infant placed in football hold with proper pillow support on the Rt breast.  Infant latched well with good deep latch. Observed frequent suckling and swallows.  Observed 15 mins and infant remained at the breast when I left the room.   Reviewed basics of breastfeeding, advised mother to breast feed on cue and feed 8-12 times in 24 hours.  Encouraged STS. Mother receptive to all teaching.  [facility] given [facility] broch[facility] with information of [facility] and community support.   Patient Name: Angela Acosta XBJYN'WToday's Date: 03/03/2018 Reason for consult: Initial assessment   Maternal Data Has patient been taught Hand Expression?: Yes Does the patient have breastfeeding experience prior to this delivery?: Yes  Feeding Feeding Type: Breast Fed  LATCH Score Latch: Grasps breast easily, tongue down, lips flanged, rhythmical sucking.  Audible Swallowing: Spontaneous and intermittent  Type of Nipple: Everted at rest and after stimulation  Comfort (Breast/Nipple): Filling, red/small blisters or bruises, mild/mod discomfort(tenderness and tiny crack with obs bleeding per mother on Lt)  Hold (Positioning): Assistance needed to correctly position infant at breast and maintain latch.  LATCH Score: 8  Interventions Interventions: Breast feeding basics reviewed;Assisted with latch;Skin to skin;Hand express;Breast compression;Adjust position;Support pillows;Position options;Expressed milk;Hand pump  Lactation Tools Discussed/Used     Consult Status Consult Status: Follow-up Date: 03/04/18 Follow-up  type: In-patient    Stevan BornKendrick, Carmelle Bamberg [facility] 03/03/2018, 3:01 PM

## 2018-03-04 MED ORDER — IBUPROFEN 600 MG PO TABS: 600.0000 mg | ORAL_TABLET | Freq: Four times a day (QID) | ORAL | 0 refills | Status: AC

## 2018-03-04 NOTE — Discharge Summary (Signed)
Obstetric Discharge Summary Reason for Admission: onset of labor Prenatal Procedures: none Intrapartum Procedures: spontaneous vaginal delivery Postpartum Procedures: none Complications-Operative and Postpartum: none Hemoglobin  Date Value Ref Range Status  03/03/2018 8.7 (L) 12.0 - 15.0 g/dL Final   HCT  Date Value Ref Range Status  03/03/2018 26.0 (L) 36.0 - 46.0 % Final    Physical Exam:  General: alert and cooperative Lochia: appropriate   Discharge Diagnoses: Term Pregnancy-delivered and meconium and nuchal cord  Discharge Information: Date: 03/04/2018 Activity: pelvic rest Diet: routine Medications: PNV, Ibuprofen and Iron Condition: stable Instructions: refer to practice specific booklet Discharge to: home Follow-up Information    Levi AlandAnderson, Suren Payne E, MD. Schedule an appointment as soon as possible for a visit in 1 month(s).   Specialty:  Obstetrics and Gynecology Contact information: [address] [place] [id number] [phone]           Newborn Data: Live born female  Birth Weight: 7 lb 10.6 oz (3476 g) APGAR: 8, 9  Newborn Delivery   Birth date/time:  03/02/2018 18:34:00 Delivery type:  Vaginal, Spontaneous     Home with mother.  Fatisha Rabalais E 03/04/2018, 10:18 AM

## 2018-03-04 NOTE — Lactation Note (Signed)
This note was copied from a baby's chart. Lactation Consultation Note  Patient Name: Angela Acosta NWGNF'AToday's Date: 03/04/2018   [facility]:  Mother sleeping; will return later for visit                    Dora SimsBeth R Rowe Warman 03/04/2018, 9:51 AM

## 2018-03-04 NOTE — Progress Notes (Signed)
PPD#2 Pt without complaints. Lochia wnl VSSAF IMP/ doing well Plan/ Will discharge

## 2018-03-04 NOTE — Lactation Note (Signed)
This note was copied from a baby's chart. Lactation Consultation Note  Patient Name: [name] ZOXWR'UToday's Date: 03/04/2018 Reason for consult: Follow-up assessment;Term  P2 mother whose infant is now 7541 hours old.  Mother breast fed her first child for 2 years.  Mother had no questions/concerns related to breast feeding and feels like latching has gone well.  Her breasts are soft and non tender and nipples have slight soreness.  Comfort gels with instructions for use provided.  Mother will continue to feed 8-12 times/24 hours or sooner if baby shows cues.  She is familiar with hand expression and feeding cues.  Mother has a DEBP for home use and has our OP phone number for questions/concerns.  Family present and ready for discharge.    Maternal Data Formula Feeding for Exclusion: No Has patient been taught Hand Expression?: Yes Does the patient have breastfeeding experience prior to this delivery?: Yes  Feeding Feeding Type: Bottle Fed - Formula  LATCH Score                   Interventions    Lactation Tools Discussed/Used     Consult Status Consult Status: Complete Date: 03/04/18 Follow-up type: Call as needed    [name] R Analee Montee 03/04/2018, 11:56 AM

## 2018-04-27 DIAGNOSIS — R202 Paresthesia of skin: Secondary | ICD-10-CM | POA: Diagnosis present

## 2018-04-27 DIAGNOSIS — F410 Panic disorder [episodic paroxysmal anxiety] without agoraphobia: Secondary | ICD-10-CM | POA: Diagnosis not present

## 2018-04-27 DIAGNOSIS — Z79899 Other long term (current) drug therapy: Secondary | ICD-10-CM | POA: Diagnosis not present

## 2018-04-28 ENCOUNTER — Emergency Department (HOSPITAL_COMMUNITY)
Admission: EM | Admit: 2018-04-28 | Discharge: 2018-04-28 | Disposition: A | Discharge status: Home / Self Care | Payer: Medicaid Other | Attending: Emergency Medicine | Admitting: Emergency Medicine

## 2018-04-28 ENCOUNTER — Other Ambulatory Visit: Payer: Self-pay

## 2018-04-28 ENCOUNTER — Encounter (HOSPITAL_COMMUNITY): Payer: Self-pay

## 2018-04-28 DIAGNOSIS — F410 Panic disorder [episodic paroxysmal anxiety] without agoraphobia: Secondary | ICD-10-CM

## 2018-04-28 NOTE — Discharge Instructions (Signed)
Contact a health care provider if: Your symptoms do not improve, or they get worse. You are not able to take your medicine as prescribed because of side effects. Get help right away if: You have serious thoughts about hurting yourself or others. You have symptoms of a panic attack. Do not drive yourself to the hospital. Have someone else drive you or call an ambulance. If you ever feel like you may hurt y

## 2018-04-28 NOTE — ED Notes (Addendum)
Patient states that she is ready to go home and her ride is here. Advised patient that she should wait to see a provider but she states that she will follow up with her primary care provider about her anxiety. Denies suicidal and homicidal ideations. Patient advised that she would be leaving AMA and she states that she understands but still wants to leave. EDP made aware of intentions to leave.

## 2018-04-28 NOTE — ED Triage Notes (Signed)
Pt states that she had a panic attack, hx of same. She is calm and cooperative at this time.

## 2018-04-28 NOTE — ED Provider Notes (Signed)
[facility]-EMERGENCY DEPT Provider Note   CSN: [id number] Arrival date & time: 04/27/18  2336     History   Chief Complaint Chief Complaint  Patient presents with  . Anxiety    HPI Angela Acosta is a 23 y.o. female.  Who presents the emergency department chief complaint of panic attack.  Patient states she has a history of the same and tonight she felt her heart racing and had an overwhelming sense of dread.  She had numbness and tingling in her hands and feet was breathing rapidly.  She said it lasted for approximately 20 minutes and has resolved completely.  She denies any active symptoms at this time  HPI  Past Medical History:  Diagnosis Date  . Anemia   . Heart murmur   . Panic disorder     Patient Active Problem List   Diagnosis Date Noted  . Indication for care in labor and delivery, antepartum 03/02/2018  . Pregnancy 03/02/2018    Past Surgical History:  Procedure Laterality Date  . NO PAST SURGERIES       OB History    Gravida  2   Para  2   Term  2   Preterm      AB      Living  2     SAB      TAB      Ectopic      Multiple  0   Live Births  2            Home Medications    Prior to Admission medications   Medication Sig Start Date End Date Taking? Authorizing Provider  albuterol (PROVENTIL HFA;VENTOLIN HFA) 108 (90 Base) MCG/ACT inhaler Inhale 2 puffs into the lungs every 4 (four) hours as needed for wheezing or shortness of breath (cough, shortness of breath or wheezing.). 05/15/17   Bing Neighbors, FNP  ibuprofen (ADVIL,MOTRIN) 600 MG tablet Take 1 tablet (600 mg total) by mouth every 6 (six) hours. 03/04/18   Levi Aland, MD  IRON PO Take 1 tablet by mouth 2 (two) times daily.    [provider]  Prenatal Vit-Fe Fumarate-FA (PRENATAL MULTIVITAMIN) TABS tablet Take 1 tablet by mouth daily at 12 noon.    [provider]    Family History Family History  Problem Relation Age of  Onset  . Hypertension Father   . Cancer Paternal Grandmother     Social History Social History   Tobacco Use  . Smoking status: Never Smoker  . Smokeless tobacco: Never Used  Substance Use Topics  . Alcohol use: Yes    Comment: rare  . Drug use: No     Allergies   Patient has no known allergies.   Review of Systems Review of Systems  Ten systems reviewed and are negative for acute change, except as noted in the HPI.   Physical Exam Updated Vital Signs BP 117/73 (BP Location: Right Arm)   Pulse 79   Temp 98.2 F (36.8 C) (Oral)   Resp 18   Ht 5\' 6"  (1.676 m)   Wt 61.2 kg   LMP 05/08/2017 (Approximate)   SpO2 99%   BMI 21.79 kg/m   Physical Exam Vitals signs and nursing note reviewed.  Constitutional:      General: She is not in acute distress.    Appearance: She is well-developed. She is not diaphoretic.  HENT:     Head: Normocephalic and atraumatic.  Eyes:  General: No scleral icterus.    Conjunctiva/sclera: Conjunctivae normal.  Neck:     Musculoskeletal: Normal range of motion.  Cardiovascular:     Rate and Rhythm: Normal rate and regular rhythm.     Heart sounds: Normal heart sounds. No murmur. No friction rub. No gallop.   Pulmonary:     Effort: Pulmonary effort is normal. No respiratory distress.     Breath sounds: Normal breath sounds.  Abdominal:     General: Bowel sounds are normal. There is no distension.     Palpations: Abdomen is soft. There is no mass.     Tenderness: There is no abdominal tenderness. There is no guarding.  Skin:    General: Skin is warm and dry.  Neurological:     Mental Status: She is alert and oriented to person, place, and time.  Psychiatric:        Behavior: Behavior normal.      ED Treatments / Results  Labs (all labs ordered are listed, but only abnormal results are displayed) Labs Reviewed - No data to display  EKG None  ECG interpretation   Date: 04/28/2018  Rate: 63  Rhythm: normal sinus  rhythm  QRS Axis: normal  Intervals: normal  ST/T Wave abnormalities: normal  Conduction Disutrbances: none  Narrative Interpretation:   Old EKG Reviewed: No significant changes noted    Radiology No results found.  Procedures Procedures (including critical care time)  Medications Ordered in ED Medications - No data to display   Initial Impression / Assessment and Plan / ED Course  I have reviewed the triage vital signs and the nursing notes.  Pertinent labs & imaging results that were available during my care of the patient were reviewed by me and considered in my medical decision making (see chart for details).     With apparent anxiety attack.  EKG is normal without any signs of arrhythmia or ischemia.  Patient appears appropriate for discharge at this time.  Discussed return precautions.  Final Clinical Impressions(s) / ED Diagnoses   Final diagnoses:  None    ED Discharge Orders    None       Arthor Captain, PA-C 04/28/18 6767    Palumbo, April, MD 04/28/18 2094

## 2018-11-17 ENCOUNTER — Emergency Department (HOSPITAL_COMMUNITY)
Admission: EM | Admit: 2018-11-17 | Discharge: 2018-11-18 | Discharge status: Against Medical Advice | Payer: Medicaid Other

## 2018-11-17 ENCOUNTER — Other Ambulatory Visit: Payer: Self-pay

## 2018-11-18 ENCOUNTER — Emergency Department (HOSPITAL_BASED_OUTPATIENT_CLINIC_OR_DEPARTMENT_OTHER): Payer: Self-pay

## 2018-11-18 ENCOUNTER — Other Ambulatory Visit: Payer: Self-pay

## 2018-11-18 ENCOUNTER — Emergency Department (HOSPITAL_BASED_OUTPATIENT_CLINIC_OR_DEPARTMENT_OTHER)
Admission: EM | Admit: 2018-11-18 | Discharge: 2018-11-18 | Disposition: A | Discharge status: Home / Self Care | Payer: Self-pay | Attending: Emergency Medicine | Admitting: Emergency Medicine

## 2018-11-18 DIAGNOSIS — K5900 Constipation, unspecified: Secondary | ICD-10-CM | POA: Insufficient documentation

## 2018-11-18 DIAGNOSIS — Z79899 Other long term (current) drug therapy: Secondary | ICD-10-CM | POA: Insufficient documentation

## 2018-11-18 LAB — URINALYSIS, ROUTINE W REFLEX MICROSCOPIC
Bilirubin Urine: NEGATIVE
Glucose, UA: NEGATIVE mg/dL
Hgb urine dipstick: NEGATIVE
Ketones, ur: NEGATIVE mg/dL
Leukocytes,Ua: NEGATIVE
Nitrite: NEGATIVE
Protein, ur: NEGATIVE mg/dL
Specific Gravity, Urine: 1.02 (ref 1.005–1.030)
pH: 8 (ref 5.0–8.0)

## 2018-11-18 LAB — PREGNANCY, URINE: Preg Test, Ur: NEGATIVE

## 2018-11-18 MED ORDER — HYDROCORTISONE 1 % EX OINT: 1.0000 "application " | TOPICAL_OINTMENT | Freq: Two times a day (BID) | CUTANEOUS | 0 refills | Status: AC

## 2018-11-18 MED ORDER — POLYETHYLENE GLYCOL 3350 17 G PO PACK: 17.0000 g | PACK | Freq: Every day | ORAL | 0 refills | Status: AC

## 2018-11-18 NOTE — ED Triage Notes (Addendum)
Pt c/o lower abdominal pain x 2 day with lower back pain. Pt verbalizes bloating, constipation x 5 days, denies N/V. Pt ambulatory

## 2018-11-18 NOTE — ED Notes (Signed)
Delay in xray. Waiting on preg test results

## 2018-11-18 NOTE — ED Notes (Signed)
ED Provider at bedside. 

## 2018-11-18 NOTE — ED Provider Notes (Signed)
[facility] EMERGENCY DEPARTMENT Provider Note   CSN: [id number] Arrival date & time: 11/18/18  1401    History   Chief Complaint Chief Complaint  Patient presents with  . Abdominal Pain    HPI Angela Acosta is a 23 y.o. female.     The history is provided by the patient.  Constipation Severity:  Mild Time since last bowel movement:  1 week Timing:  Constant Progression:  Improving Chronicity:  New Context: not dehydration   Stool description:  Hard Relieved by:  Nothing Worsened by:  Nothing Associated symptoms: abdominal pain   Associated symptoms: no back pain, no diarrhea, no dysuria, no fever, no nausea, no urinary retention and no vomiting   Risk factors: no hx of abdominal surgery, no recent antibiotic use and no recent illness     Past Medical History:  Diagnosis Date  . Anemia   . Heart murmur   . Panic disorder     Patient Active Problem List   Diagnosis Date Noted  . Indication for care in labor and delivery, antepartum 03/02/2018  . Pregnancy 03/02/2018    Past Surgical History:  Procedure Laterality Date  . NO PAST SURGERIES       OB History    Gravida  2   Para  2   Term  2   Preterm      AB      Living  2     SAB      TAB      Ectopic      Multiple  0   Live Births  2            Home Medications    Prior to Admission medications   Medication Sig Start Date End Date Taking? Authorizing Provider  albuterol (PROVENTIL HFA;VENTOLIN HFA) 108 (90 Base) MCG/ACT inhaler Inhale 2 puffs into the lungs every 4 (four) hours as needed for wheezing or shortness of breath (cough, shortness of breath or wheezing.). 05/15/17   Scot Jun, FNP  hydrocortisone 1 % ointment Apply 1 application topically 2 (two) times daily. Apply around rectum as needed 11/18/18   Lennice Sites, DO  ibuprofen (ADVIL,MOTRIN) 600 MG tablet Take 1 tablet (600 mg total) by mouth every 6 (six) hours. 03/04/18   Olga Millers, MD   IRON PO Take 1 tablet by mouth 2 (two) times daily.    [provider]  polyethylene glycol (MIRALAX / GLYCOLAX) 17 g packet Take 17 g by mouth daily for 14 doses. 11/18/18 12/02/18  Lennice Sites, DO  Prenatal Vit-Fe Fumarate-FA (PRENATAL MULTIVITAMIN) TABS tablet Take 1 tablet by mouth daily at 12 noon.    [provider]    Family History Family History  Problem Relation Age of Onset  . Hypertension Father   . Cancer Paternal Grandmother     Social History Social History   Tobacco Use  . Smoking status: Never Smoker  . Smokeless tobacco: Never Used  Substance Use Topics  . Alcohol use: Yes    Comment: rare  . Drug use: No     Allergies   Patient has no known allergies.   Review of Systems Review of Systems  Constitutional: Negative for chills and fever.  HENT: Negative for ear pain and sore throat.   Eyes: Negative for pain and visual disturbance.  Respiratory: Negative for cough and shortness of breath.   Cardiovascular: Negative for chest pain and palpitations.  Gastrointestinal: Positive for abdominal pain  and constipation. Negative for abdominal distention, diarrhea, nausea and vomiting.  Genitourinary: Negative for dysuria and hematuria.  Musculoskeletal: Negative for arthralgias and back pain.  Skin: Negative for color change and rash.  Neurological: Negative for seizures and syncope.  All other systems reviewed and are negative.    Physical Exam Updated Vital Signs  ED Triage Vitals  Enc Vitals Group     BP 11/18/18 1411 117/81     Pulse Rate 11/18/18 1411 72     Resp 11/18/18 1411 14     Temp 11/18/18 1411 98.4 F (36.9 C)     Temp Source 11/18/18 1411 Oral     SpO2 11/18/18 1411 99 %     Weight 11/18/18 1412 124 lb 3.2 oz (56.3 kg)     Height 11/18/18 1412 5\' 7"  (1.702 m)     Head Circumference --      Peak Flow --      Pain Score 11/18/18 1411 7     Pain Loc --      Pain Edu? --      Excl. in GC? --     Physical Exam  Vitals signs and nursing note reviewed.  Constitutional:      General: She is not in acute distress.    Appearance: She is well-developed. She is not ill-appearing.  HENT:     Head: Normocephalic and atraumatic.     Mouth/Throat:     Mouth: Mucous membranes are moist.  Eyes:     Extraocular Movements: Extraocular movements intact.     Conjunctiva/sclera: Conjunctivae normal.  Neck:     Musculoskeletal: Neck supple.  Cardiovascular:     Rate and Rhythm: Normal rate and regular rhythm.     Heart sounds: Normal heart sounds. No murmur.  Pulmonary:     Effort: Pulmonary effort is normal. No respiratory distress.     Breath sounds: Normal breath sounds.  Abdominal:     General: Abdomen is flat. There is no distension.     Palpations: Abdomen is soft.     Tenderness: There is generalized abdominal tenderness. There is no right CVA tenderness, left CVA tenderness, guarding or rebound. Negative signs include Murphy's sign, Rovsing's sign, McBurney's sign and psoas sign.  Skin:    General: Skin is warm and dry.  Neurological:     Mental Status: She is alert.      ED Treatments / Results  Labs (all labs ordered are listed, but only abnormal results are displayed) Labs Reviewed  URINALYSIS, ROUTINE W REFLEX MICROSCOPIC - Abnormal; Notable for the following components:      Result Value   APPearance HAZY (*)    All other components within normal limits  PREGNANCY, URINE    EKG None  Radiology Dg Abdomen 1 View  Result Date: 11/18/2018 CLINICAL DATA:  Pelvic pain and dyspareunia.  Dysuria. EXAM: ABDOMEN - 1 VIEW COMPARISON:  None. FINDINGS: There is moderate stool in the colon. There is no bowel dilatation or air-fluid level to suggest bowel obstruction. No free air. Tiny pelvic calcifications likely represent phleboliths. IMPRESSION: No bowel obstruction or free air.  Moderate stool in colon. Electronically Signed   By: Bretta BangWilliam  Woodruff III M.D.   On: 11/18/2018 15:52     Procedures Procedures (including critical care time)  Medications Ordered in ED Medications - No data to display   Initial Impression / Assessment and Plan / ED Course  I have reviewed the triage vital signs and the nursing notes.  Pertinent labs & imaging results that were available during my care of the patient were reviewed by me and considered in my medical decision making (see chart for details).       Angela Acosta is a 23 year old female w/ no significant medical history who presents to the ED with constipation, abdominal pain.  Patient with normal vitals.  No fever.  Patient has had constipation for the past week.  Had first bowel movement this morning that was overall painful.  She states that it was multiple hard pellets.  Does not drink much water.  No pain currently.  Has had mostly diffuse abdominal discomfort.  Feels bloated.  Has some generalized tenderness on exam but nothing is focal to suggest ovarian pathology or appendicitis.  She denies any vaginal symptoms.  No concern for STD.  Urinalysis negative for infection.  Pregnancy test negative.  X-ray showed stool burden.  Will prescribe MiraLAX, hydrocortisone cream for possible hemorrhoids.  Overall no concern for acute abdominal process other than likely constipation at this time given history and physical.  She is given strict return precautions if pain localized more in the right lower quadrant, fever, worsening pain.  No concern for bowel obstruction.  She has not had any nausea or vomiting.  No history abdominal surgeries.  Patient discharged in ED in good condition.  Given return precautions.  This chart was dictated using voice recognition software.  Despite best efforts to proofread,  errors can occur which can change the documentation meaning.    Final Clinical Impressions(s) / ED Diagnoses   Final diagnoses:  Constipation, unspecified constipation type    ED Discharge Orders         Ordered    polyethylene  glycol (MIRALAX / GLYCOLAX) 17 g packet  Daily     11/18/18 1613    hydrocortisone 1 % ointment  2 times daily     11/18/18 1614           Bradie Sangiovanni, Madelaine Bhatdam, DO 11/18/18 1615

## 2020-12-10 ENCOUNTER — Encounter (HOSPITAL_COMMUNITY): Payer: Self-pay | Admitting: Emergency Medicine

## 2020-12-10 ENCOUNTER — Emergency Department (HOSPITAL_COMMUNITY)
Admission: EM | Admit: 2020-12-10 | Discharge: 2020-12-10 | Disposition: A | Discharge status: Home / Self Care | Payer: Medicaid Other

## 2020-12-10 ENCOUNTER — Other Ambulatory Visit: Payer: Self-pay

## 2020-12-10 ENCOUNTER — Emergency Department (HOSPITAL_COMMUNITY)
Admission: EM | Admit: 2020-12-10 | Discharge: 2020-12-10 | Disposition: A | Discharge status: Home / Self Care | Payer: Medicaid Other | Attending: Emergency Medicine | Admitting: Emergency Medicine

## 2020-12-10 DIAGNOSIS — R519 Headache, unspecified: Secondary | ICD-10-CM | POA: Insufficient documentation

## 2020-12-10 DIAGNOSIS — N939 Abnormal uterine and vaginal bleeding, unspecified: Secondary | ICD-10-CM | POA: Diagnosis not present

## 2020-12-10 DIAGNOSIS — R109 Unspecified abdominal pain: Secondary | ICD-10-CM | POA: Insufficient documentation

## 2020-12-10 DIAGNOSIS — N9489 Other specified conditions associated with female genital organs and menstrual cycle: Secondary | ICD-10-CM | POA: Diagnosis not present

## 2020-12-10 LAB — URINALYSIS, ROUTINE W REFLEX MICROSCOPIC
Bilirubin Urine: NEGATIVE
Glucose, UA: NEGATIVE mg/dL
Hgb urine dipstick: NEGATIVE
Ketones, ur: NEGATIVE mg/dL
Leukocytes,Ua: NEGATIVE
Nitrite: NEGATIVE
Protein, ur: NEGATIVE mg/dL
Specific Gravity, Urine: 1.011 (ref 1.005–1.030)
pH: 8 (ref 5.0–8.0)

## 2020-12-10 LAB — COMPREHENSIVE METABOLIC PANEL
ALT: 13 U/L (ref 0–44)
AST: 22 U/L (ref 15–41)
Albumin: 4.3 g/dL (ref 3.5–5.0)
Alkaline Phosphatase: 46 U/L (ref 38–126)
Anion gap: 6 (ref 5–15)
BUN: 10 mg/dL (ref 6–20)
CO2: 27 mmol/L (ref 22–32)
Calcium: 8.9 mg/dL (ref 8.9–10.3)
Chloride: 105 mmol/L (ref 98–111)
Creatinine, Ser: 0.74 mg/dL (ref 0.44–1.00)
GFR, Estimated: >60 mL/min (ref >60)
Glucose, Bld: 102 mg/dL — ABNORMAL HIGH (ref 70–99)
Potassium: 3.6 mmol/L (ref 3.5–5.1)
Sodium: 138 mmol/L (ref 135–145)
Total Bilirubin: 0.8 mg/dL (ref 0.3–1.2)
Total Protein: 7.7 g/dL (ref 6.5–8.1)

## 2020-12-10 LAB — CBC WITH DIFFERENTIAL/PLATELET
Abs Immature Granulocytes: 0.02 10*3/uL (ref 0.00–0.07)
Basophils Absolute: 0 10*3/uL (ref 0.0–0.1)
Basophils Relative: 1 %
Eosinophils Absolute: 0 10*3/uL (ref 0.0–0.5)
Eosinophils Relative: 1 %
HCT: 38.5 % (ref 36.0–46.0)
Hemoglobin: 12.3 g/dL (ref 12.0–15.0)
Immature Granulocytes: 0 %
Lymphocytes Relative: 39 %
Lymphs Abs: 2 10*3/uL (ref 0.7–4.0)
MCH: 25.2 pg — ABNORMAL LOW (ref 26.0–34.0)
MCHC: 31.9 g/dL (ref 30.0–36.0)
MCV: 78.9 fL — ABNORMAL LOW (ref 80.0–100.0)
Monocytes Absolute: 0.4 10*3/uL (ref 0.1–1.0)
Monocytes Relative: 7 %
Neutro Abs: 2.6 10*3/uL (ref 1.7–7.7)
Neutrophils Relative %: 52 %
Platelets: 258 10*3/uL (ref 150–400)
RBC: 4.88 MIL/uL (ref 3.87–5.11)
RDW: 15 % (ref 11.5–15.5)
WBC: 5 10*3/uL (ref 4.0–10.5)
nRBC: 0 % (ref 0.0–0.2)

## 2020-12-10 LAB — LIPASE, BLOOD: Lipase: 36 U/L (ref 11–51)

## 2020-12-10 LAB — WET PREP, GENITAL
Clue Cells Wet Prep HPF POC: NONE SEEN
Sperm: NONE SEEN
Trich, Wet Prep: NONE SEEN
Yeast Wet Prep HPF POC: NONE SEEN

## 2020-12-10 LAB — I-STAT BETA HCG BLOOD, ED (MC, WL, AP ONLY): I-stat hCG, quantitative: <5 m[IU]/mL (ref <5)

## 2020-12-10 NOTE — ED Notes (Signed)
Patient told this tech she was going to leave to go to [facility] because the wait would be shorter. This tech encouraged pt to stay to be seen, patient left.

## 2020-12-10 NOTE — ED Triage Notes (Signed)
Complains of abnormal vaginal bleeding, she had her period last week, denies hx of abnormal bleeding. Is wearing a pad, states it is saturated, this is her first one today. G2P2. States mild cramping like her period.

## 2020-12-10 NOTE — Discharge Instructions (Addendum)
  You were evaluated in the Emergency Department and after careful evaluation, we did not find any emergent condition requiring admission or further testing in the hospital.   Your exam/testing today was overall reassuring.  Symptoms seem to be due to abnormal vaginal bleeding, this could be from multiple reasons, you can use the attached instructions.  I think this is most likely due to the Plan B that you took.  I want you to follow-up with your GYN, I do not think that you need any medications at this time, if your vaginal bleeding becomes worse or if you become dizzy or feel like you are going to pass out please come back to the ER.  If you do not have a GYN I did provide you with information for 1, you can take ibuprofen as directed on the bottle for your abdominal cramping.  Your gonorrhea chlamydia test will come back in the next 48 hours on MyChart, if this is positive please come back to the ER for treatment. Please return to the Emergency Department if you experience any worsening of your condition.  Thank you for allowing [place] to be a part of your care. Please speak to your pharmacist about any new medications prescribed today in regards to side effects or interactions with other medications.

## 2020-12-10 NOTE — ED Provider Notes (Signed)
[facility]-EMERGENCY DEPT Provider Note   CSN: [id number] Arrival date & time: 12/10/20  1509     History No chief complaint on file.   Angela Acosta is a 25 y.o. female with no pertinent past medical history that presents the emerge department today for vaginal bleeding.  Patient states that she had her normal menstrual cycle 2 weeks ago, states that then yesterday she started having vaginal bleeding.  Denies any vaginal clots.  Patient states that her menstrual cycles are normally every 4 weeks and are regular.  States that they are about 4 to 5 days and are not extremely heavy, has never had a menstrual cycle problem before.  Patient states that she also admits to some abdominal cramping, headache and hot flashes, she states that she gets all of these when she has her normal menstrual cycle.  States that she feels like she is on her period currently.  Denies any pelvic pain or vaginal pain.  Denies any vaginal discharge or chance of STDs.  Patient states that she did have intercourse about 2 days ago, did not have it yesterday.  Denies any foreign objects in her vagina.  Patient also states that she took Plan B last week.  Is not on birth control.  Denies any fevers, nausea, vomiting, back pain or other complaints at this time.  No chest pain or shortness of breath.  Concerned that she could be pregnant.  Denies any urinary problems.  States that she has used 1 pad today.  HPI     Past Medical History:  Diagnosis Date   Anemia    Heart murmur    Panic disorder     Patient Active Problem List   Diagnosis Date Noted   Indication for care in labor and delivery, antepartum 03/02/2018   Pregnancy 03/02/2018    Past Surgical History:  Procedure Laterality Date   NO PAST SURGERIES       OB History     Gravida  2   Para  2   Term  2   Preterm      AB      Living  2      SAB      IAB      Ectopic      Multiple  0   Live Births  2            Family History  Problem Relation Age of Onset   Hypertension Father    Cancer Paternal Grandmother     Social History   Tobacco Use   Smoking status: Never   Smokeless tobacco: Never  Vaping Use   Vaping Use: Never used  Substance Use Topics   Alcohol use: Yes    Comment: rare   Drug use: No    Home Medications Prior to Admission medications   Medication Sig Start Date End Date Taking? Authorizing Provider  albuterol (PROVENTIL HFA;VENTOLIN HFA) 108 (90 Base) MCG/ACT inhaler Inhale 2 puffs into the lungs every 4 (four) hours as needed for wheezing or shortness of breath (cough, shortness of breath or wheezing.). 05/15/17   Bing Neighbors, FNP  hydrocortisone 1 % ointment Apply 1 application topically 2 (two) times daily. Apply around rectum as needed 11/18/18   Virgina Norfolk, DO  ibuprofen (ADVIL,MOTRIN) 600 MG tablet Take 1 tablet (600 mg total) by mouth every 6 (six) hours. 03/04/18   Levi Aland, MD  IRON PO Take 1 tablet by mouth 2 (  two) times daily.    [provider]  Prenatal Vit-Fe Fumarate-FA (PRENATAL MULTIVITAMIN) TABS tablet Take 1 tablet by mouth daily at 12 noon.    [provider]    Allergies    Patient has no known allergies.  Review of Systems   Review of Systems  Constitutional:  Negative for chills, diaphoresis, fatigue and fever.  HENT:  Negative for congestion, sore throat and trouble swallowing.   Eyes:  Negative for pain and visual disturbance.  Respiratory:  Negative for cough, shortness of breath and wheezing.   Cardiovascular:  Negative for chest pain, palpitations and leg swelling.  Gastrointestinal:  Negative for abdominal distention, abdominal pain, diarrhea, nausea and vomiting.  Genitourinary:  Positive for menstrual problem and vaginal bleeding. Negative for difficulty urinating, dyspareunia, dysuria, enuresis, flank pain, vaginal discharge and vaginal pain.  Musculoskeletal:  Negative for back pain, neck pain  and neck stiffness.  Skin:  Negative for pallor.  Neurological:  Negative for dizziness, speech difficulty, weakness and headaches.  Psychiatric/Behavioral:  Negative for confusion.    Physical Exam Updated Vital Signs BP 105/71   Pulse 63   Temp 98.3 F (36.8 C) (Oral)   Resp 18   Ht 5\' 7"  (1.702 m)   Wt 65.8 kg   LMP 11/29/2020 (Exact Date)   SpO2 100%   BMI 22.71 kg/m   Physical Exam Constitutional:      General: She is not in acute distress.    Appearance: Normal appearance. She is not ill-appearing, toxic-appearing or diaphoretic.  HENT:     Mouth/Throat:     Mouth: Mucous membranes are moist.     Pharynx: Oropharynx is clear.  Eyes:     General: No scleral icterus.    Extraocular Movements: Extraocular movements intact.     Pupils: Pupils are equal, round, and reactive to light.  Cardiovascular:     Rate and Rhythm: Normal rate and regular rhythm.     Pulses: Normal pulses.     Heart sounds: Normal heart sounds.  Pulmonary:     Effort: Pulmonary effort is normal. No respiratory distress.     Breath sounds: Normal breath sounds. No stridor. No wheezing, rhonchi or rales.  Chest:     Chest wall: No tenderness.  Abdominal:     General: Abdomen is flat. There is no distension.     Palpations: Abdomen is soft.     Tenderness: There is no abdominal tenderness. There is no guarding or rebound.  Genitourinary:    Comments: Chaperone present.  Patient with normal external vaginal exam, internal vaginal exam with maroon blood coming from cervix, cervix is closed, nonfriable.  No other vaginal discharge noted.  Bimanual exam without any adnexal tenderness or cervical motion tenderness.  No lesions noted. Musculoskeletal:        General: No swelling or tenderness. Normal range of motion.     Cervical back: Normal range of motion and neck supple. No rigidity.     Right lower leg: No edema.     Left lower leg: No edema.  Skin:    General: Skin is warm and dry.      Capillary Refill: Capillary refill takes less than 2 seconds.     Coloration: Skin is not pale.  Neurological:     General: No focal deficit present.     Mental Status: She is alert and oriented to person, place, and time.  Psychiatric:        Mood and Affect: Mood normal.  Behavior: Behavior normal.    ED Results / Procedures / Treatments   Labs (all labs ordered are listed, but only abnormal results are displayed) Labs Reviewed  WET PREP, GENITAL - Abnormal; Notable for the following components:      Result Value   WBC, Wet Prep HPF POC FEW (*)    All other components within normal limits  CBC WITH DIFFERENTIAL/PLATELET - Abnormal; Notable for the following components:   MCV 78.9 (*)    MCH 25.2 (*)    All other components within normal limits  COMPREHENSIVE METABOLIC PANEL - Abnormal; Notable for the following components:   Glucose, Bld 102 (*)    All other components within normal limits  URINALYSIS, ROUTINE W REFLEX MICROSCOPIC - Abnormal; Notable for the following components:   Color, Urine STRAW (*)    All other components within normal limits  LIPASE, BLOOD  I-STAT BETA HCG BLOOD, ED ([facility], [facility], [facility] ONLY)  GC/CHLAMYDIA PROBE AMP ([place]) NOT AT [facility]    EKG None  Radiology No results found.  Procedures Procedures   Medications Ordered in ED Medications - No data to display  ED Course  I have reviewed the triage vital signs and the nursing notes.  Pertinent labs & imaging results that were available during my care of the patient were reviewed by me and considered in my medical decision making (see chart for details).    MDM Rules/Calculators/A&P                          25 year old female that presents the emerge department today for abnormal vaginal bleeding.  Patient appears very well, hemodynamically stable.  Pelvic exam unremarkable, work-up today unremarkable with normal hemoglobin.  I think patient most likely had abnormal vaginal bleeding  due to taking Plan B, did discuss this with patient in depth.  Do not think that patient needs any medication management at this time, will follow up with her GYN.  Doubt need for further emergent work up at this time. I explained the diagnosis and have given explicit precautions to return to the ER including for any other new or worsening symptoms. The patient understands and accepts the medical plan as it's been dictated and I have answered their questions. Discharge instructions concerning home care and prescriptions have been given. The patient is STABLE and is discharged to home in good condition.  Final Clinical Impression(s) / ED Diagnoses Final diagnoses:  Abnormal vaginal bleeding    Rx / DC Orders ED Discharge Orders     None        Farrel Gordon, PA-C 12/10/20 1842    Wynetta Fines, MD 12/12/20 0005

## 2020-12-11 LAB — GC/CHLAMYDIA PROBE AMP (~~LOC~~) NOT AT ARMC
Chlamydia: NEGATIVE
Comment: NEGATIVE
Comment: NORMAL
Neisseria Gonorrhea: NEGATIVE

## 2021-08-25 ENCOUNTER — Other Ambulatory Visit: Payer: Self-pay

## 2021-08-25 ENCOUNTER — Emergency Department (HOSPITAL_BASED_OUTPATIENT_CLINIC_OR_DEPARTMENT_OTHER): Payer: Medicaid Other

## 2021-08-25 ENCOUNTER — Encounter (HOSPITAL_BASED_OUTPATIENT_CLINIC_OR_DEPARTMENT_OTHER): Payer: Self-pay | Admitting: Emergency Medicine

## 2021-08-25 ENCOUNTER — Emergency Department (HOSPITAL_BASED_OUTPATIENT_CLINIC_OR_DEPARTMENT_OTHER)
Admission: EM | Admit: 2021-08-25 | Discharge: 2021-08-25 | Disposition: A | Discharge status: Home / Self Care | Payer: Medicaid Other | Attending: Emergency Medicine | Admitting: Emergency Medicine

## 2021-08-25 DIAGNOSIS — M79642 Pain in left hand: Secondary | ICD-10-CM | POA: Diagnosis present

## 2021-08-25 DIAGNOSIS — M7918 Myalgia, other site: Secondary | ICD-10-CM

## 2021-08-25 DIAGNOSIS — Y92410 Unspecified street and highway as the place of occurrence of the external cause: Secondary | ICD-10-CM | POA: Diagnosis not present
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
## 2022-02-26 ENCOUNTER — Encounter (HOSPITAL_BASED_OUTPATIENT_CLINIC_OR_DEPARTMENT_OTHER): Payer: Self-pay | Admitting: Emergency Medicine

## 2022-02-26 ENCOUNTER — Other Ambulatory Visit: Payer: Self-pay

## 2022-02-26 ENCOUNTER — Emergency Department (HOSPITAL_BASED_OUTPATIENT_CLINIC_OR_DEPARTMENT_OTHER)
Admission: EM | Admit: 2022-02-26 | Discharge: 2022-02-26 | Disposition: A | Discharge status: Home / Self Care | Payer: Medicaid Other | Attending: Emergency Medicine | Admitting: Emergency Medicine

## 2022-02-26 DIAGNOSIS — Z202 Contact with and (suspected) exposure to infections with a predominantly sexual mode of transmission: Secondary | ICD-10-CM | POA: Diagnosis not present

## 2022-02-26 DIAGNOSIS — Z711 Person with feared health complaint in whom no diagnosis is made: Secondary | ICD-10-CM

## 2022-02-26 DIAGNOSIS — N898 Other specified noninflammatory disorders of vagina: Secondary | ICD-10-CM | POA: Diagnosis not present

## 2022-02-26 DIAGNOSIS — R102 Pelvic and perineal pain: Secondary | ICD-10-CM | POA: Diagnosis not present

## 2022-02-26 DIAGNOSIS — R1030 Lower abdominal pain, unspecified: Secondary | ICD-10-CM | POA: Diagnosis present

## 2022-02-26 DIAGNOSIS — M79669 Pain in unspecified lower leg: Secondary | ICD-10-CM | POA: Insufficient documentation

## 2022-02-26 LAB — COMPREHENSIVE METABOLIC PANEL
ALT: 16 U/L (ref 0–44)
AST: 28 U/L (ref 15–41)
Albumin: 4.4 g/dL (ref 3.5–5.0)
Alkaline Phosphatase: 53 U/L (ref 38–126)
Anion gap: 8 (ref 5–15)
BUN: 14 mg/dL (ref 6–20)
CO2: 26 mmol/L (ref 22–32)
Calcium: 9.2 mg/dL (ref 8.9–10.3)
Chloride: 101 mmol/L (ref 98–111)
Creatinine, Ser: 0.76 mg/dL (ref 0.44–1.00)
GFR, Estimated: >60 mL/min (ref >60)
Glucose, Bld: 109 mg/dL — ABNORMAL HIGH (ref 70–99)
Potassium: 3.9 mmol/L (ref 3.5–5.1)
Sodium: 135 mmol/L (ref 135–145)
Total Bilirubin: 0.6 mg/dL (ref 0.3–1.2)
Total Protein: 8.2 g/dL — ABNORMAL HIGH (ref 6.5–8.1)

## 2022-02-26 LAB — CBC
HCT: 37.9 % (ref 36.0–46.0)
Hemoglobin: 12.5 g/dL (ref 12.0–15.0)
MCH: 25.1 pg — ABNORMAL LOW (ref 26.0–34.0)
MCHC: 33 g/dL (ref 30.0–36.0)
MCV: 76.1 fL — ABNORMAL LOW (ref 80.0–100.0)
Platelets: 279 10*3/uL (ref 150–400)
RBC: 4.98 MIL/uL (ref 3.87–5.11)
RDW: 15 % (ref 11.5–15.5)
WBC: 6.8 10*3/uL (ref 4.0–10.5)
nRBC: 0 % (ref 0.0–0.2)

## 2022-02-26 LAB — URINALYSIS, ROUTINE W REFLEX MICROSCOPIC
Bilirubin Urine: NEGATIVE
Glucose, UA: NEGATIVE mg/dL
Hgb urine dipstick: NEGATIVE
Ketones, ur: NEGATIVE mg/dL
Leukocytes,Ua: NEGATIVE
Nitrite: NEGATIVE
Protein, ur: NEGATIVE mg/dL
Specific Gravity, Urine: 1.02 (ref 1.005–1.030)
pH: 7 (ref 5.0–8.0)

## 2022-02-26 LAB — LIPASE, BLOOD: Lipase: 25 U/L (ref 11–51)

## 2022-02-26 LAB — WET PREP, GENITAL
Clue Cells Wet Prep HPF POC: NONE SEEN
Trich, Wet Prep: NONE SEEN
WBC, Wet Prep HPF POC: <10 (ref <10)
Yeast Wet Prep HPF POC: NONE SEEN

## 2022-02-26 LAB — PREGNANCY, URINE: Preg Test, Ur: NEGATIVE

## 2022-02-26 MED ORDER — NAPROXEN 375 MG PO TABS: 375.0000 mg | ORAL_TABLET | Freq: Two times a day (BID) | ORAL | 0 refills | Status: AC

## 2022-02-26 MED ORDER — DOXYCYCLINE HYCLATE 100 MG PO CAPS: 100.0000 mg | ORAL_CAPSULE | Freq: Two times a day (BID) | ORAL | 0 refills | Status: AC

## 2022-02-26 MED ORDER — CEFTRIAXONE SODIUM 500 MG IJ SOLR
500.0000 mg | Freq: Once | INTRAMUSCULAR | Status: AC
  Administered 2022-02-26: 500 mg via INTRAMUSCULAR
  Filled 2022-02-26: qty 500

## 2022-02-26 NOTE — Discharge Instructions (Addendum)
As we discussed, your gonorrhea and Chlamydia swabs are both pending at your discharge.  These take 24 to 48 hours to result.  You will need to review these results online on your MyChart.  If you are positive, you will need to abstain from sexual intercourse until you are able to follow-up with OB/GYN for a test of cure.  I have given you a referral to OB/GYN with a number to call to schedule an appointment for continued evaluation and management of your symptoms.  I have also given you a prescription for an antibiotic doxycycline for you to take in its entirety for management of your symptoms.  I have also given naproxen to help with pain.  Return if development of any new or worsening symptoms.

## 2022-02-26 NOTE — ED Triage Notes (Signed)
Pt arrives pov, steady gait, c/o bilateral leg tingling and pain at night when lying down x 2 days. Pt also c/o lower abdominal pain and lower back pain x 1 week. Denies fever or dysuria. Pt on phone in triage

## 2022-02-26 NOTE — ED Provider Notes (Signed)
[facility] EMERGENCY DEPARTMENT Provider Note   CSN: [id number] Arrival date & time: 02/26/22  1607     History  Chief Complaint  Patient presents with   Abdominal Pain   Leg Pain    Angela Acosta is a 26 y.o. female.  Patient with no pertinent past medical history presents today with complaints of lower abdominal pain.  She states that same began 2 days ago and has been persistent since.  She states that it only hurts when she lays flat in bed at night.  States that the pain is throughout her lower abdomen and into her back area and down her legs as well.  She denies any associated nausea, vomiting, or diarrhea.  No hematuria or dysuria.  Does endorse some new vaginal discharge.  She is sexually active and does have concern for STDs.  Denies any history of similar symptoms previously.  Denies any history of abdominal surgeries.  LMP was 2 weeks ago.  She is not on any form of birth control.  The history is provided by the patient. No language interpreter was used.  Abdominal Pain Associated symptoms: vaginal discharge   Leg Pain      Home Medications Prior to Admission medications   Medication Sig Start Date End Date Taking? Authorizing Provider  albuterol (PROVENTIL HFA;VENTOLIN HFA) 108 (90 Base) MCG/ACT inhaler Inhale 2 puffs into the lungs every 4 (four) hours as needed for wheezing or shortness of breath (cough, shortness of breath or wheezing.). 05/15/17   Bing Neighbors, FNP  hydrocortisone 1 % ointment Apply 1 application topically 2 (two) times daily. Apply around rectum as needed 11/18/18   Virgina Norfolk, DO  ibuprofen (ADVIL,MOTRIN) 600 MG tablet Take 1 tablet (600 mg total) by mouth every 6 (six) hours. 03/04/18   Levi Aland, MD  IRON PO Take 1 tablet by mouth 2 (two) times daily.    [provider]  Prenatal Vit-Fe Fumarate-FA (PRENATAL MULTIVITAMIN) TABS tablet Take 1 tablet by mouth daily at 12 noon.    [provider]       Allergies    Patient has no known allergies.    Review of Systems   Review of Systems  Gastrointestinal:  Positive for abdominal pain.  Genitourinary:  Positive for vaginal discharge.  All other systems reviewed and are negative.   Physical Exam Updated Vital Signs BP 128/86 (BP Location: Right Arm)   Pulse 81   Temp 98.2 F (36.8 C) (Oral)   Resp 18   Ht 5\' 6"  (1.676 m)   Wt 61.2 kg   LMP 02/19/2022   SpO2 100%   BMI 21.79 kg/m  Physical Exam Vitals and nursing note reviewed. Exam conducted with a chaperone present.  Constitutional:      General: She is not in acute distress.    Appearance: Normal appearance. She is normal weight. She is not ill-appearing, toxic-appearing or diaphoretic.  HENT:     Head: Normocephalic and atraumatic.  Cardiovascular:     Rate and Rhythm: Normal rate.  Pulmonary:     Effort: Pulmonary effort is normal. No respiratory distress.  Abdominal:     General: Abdomen is flat.     Palpations: Abdomen is soft.     Tenderness: There is no abdominal tenderness.  Genitourinary:    Uterus: Normal.      Adnexa: Right adnexa normal and left adnexa normal.     Comments: White cloudy discharge present in the vagina. No blood or  other abnormalities present. +CMT, no adnexal tenderness. Musculoskeletal:        General: Normal range of motion.     Cervical back: Normal range of motion.  Skin:    General: Skin is warm and dry.  Neurological:     General: No focal deficit present.     Mental Status: She is alert.  Psychiatric:        Mood and Affect: Mood normal.        Behavior: Behavior normal.     ED Results / Procedures / Treatments   Labs (all labs ordered are listed, but only abnormal results are displayed) Labs Reviewed  COMPREHENSIVE METABOLIC PANEL - Abnormal; Notable for the following components:      Result Value   Glucose, Bld 109 (*)    Total Protein 8.2 (*)    All other components within normal limits  CBC - Abnormal;  Notable for the following components:   MCV 76.1 (*)    MCH 25.1 (*)    All other components within normal limits  WET PREP, GENITAL  LIPASE, BLOOD  URINALYSIS, ROUTINE W REFLEX MICROSCOPIC  PREGNANCY, URINE  GC/CHLAMYDIA PROBE AMP ([place]) NOT AT [employer]    EKG None  Radiology No results found.  Procedures Procedures    Medications Ordered in ED Medications  cefTRIAXone (ROCEPHIN) injection 500 mg (500 mg Intramuscular Given 02/26/22 2010)    ED Course/ Medical Decision Making/ A&P                           Medical Decision Making Amount and/or Complexity of Data Reviewed Labs: ordered.  Risk Prescription drug management.   This patient is a 26 y.o. female who presents to the ED for concern of pelvic pain and vaginal discharge, this involves an extensive number of treatment options, and is a complaint that carries with it a high risk of complications and morbidity. The emergent differential diagnosis prior to evaluation includes, but is not limited to,  PID, ectopic pregnancy, appendicitis, urinary calculi, primary dysmenorrhea, septic abortion, ruptured ovarian cyst or tumor, ovarian torsion, tubo-ovarian abscess, degeneration of fibroid, endometriosis, diverticulitis, cystitis.  This is not an exhaustive differential.   Past Medical History / Co-morbidities / Social History: none   Physical Exam: Physical exam performed. The pertinent findings include: no abdominal tenderness to palpation. +CMT, no adnexal tenderness  Lab Tests: I ordered, and personally interpreted labs.  The pertinent results include:  no leukocytosis. No other acute laboratory findings. Wet prep unremarkable   Medications: I ordered medication including Rocephin  for PID. Reevaluation of the patient after these medicines showed that the patient stayed the same. I have reviewed the patients home medicines and have made adjustments as needed.  Disposition:  Patient presents today with  complaints of pelvic pain and vaginal discharge.  She is afebrile, nontoxic-appearing, and in no acute distress with reassuring vital signs.  Physical exam reveals abdomen that is soft and nontender.  She does have some cervical motion tenderness on exam and vaginal discharge present.  Her wet prep is unremarkable, however GC/chlamydia are pending at this time.  Will treat empirically for PID.  Patient is not septic and does not have any right upper quadrant tenderness.  Therefore low suspicion for a tubo-ovarian abscess or Lynnae January. Therefore will defer imaging at this time. Given Rocephin in the ER today and will send home with doxycycline as well.  Will give naproxen prescription for pain.  Patient is understanding and amenable with plan.  She has been educated on how to review the results of her GC/chlamydia swabs online and to abstain from sexual intercourse if it test is positive. Patient to be discharged with instructions to follow up with OBGYN/PCP. Discussed importance of using protection when sexually active.  Patient also educated on red flag symptoms that would prompt immediate return.  Patient discharged in stable condition.  Findings and plan of care discussed with supervising physician Dr. Dalene Seltzer who is in agreement.    Final Clinical Impression(s) / ED Diagnoses Final diagnoses:  Pelvic pain  Concern about STD in female without diagnosis    Rx / DC Orders ED Discharge Orders          Ordered    doxycycline (VIBRAMYCIN) 100 MG capsule  2 times daily        02/26/22 2021    naproxen (NAPROSYN) 375 MG tablet  2 times daily        02/26/22 2021          An After Visit Summary was printed and given to the patient.     Vear Clock 02/26/22 2023    Alvira Monday, MD 02/28/22 1258

## 2022-02-26 NOTE — ED Notes (Signed)
Urine sent to Lab

## 2022-02-28 LAB — GC/CHLAMYDIA PROBE AMP (~~LOC~~) NOT AT ARMC
Chlamydia: NEGATIVE
Comment: NEGATIVE
Comment: NORMAL
Neisseria Gonorrhea: NEGATIVE
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
## 2022-12-13 ENCOUNTER — Emergency Department (HOSPITAL_COMMUNITY)
Admission: EM | Admit: 2022-12-13 | Discharge: 2022-12-13 | Discharge status: Against Medical Advice | Payer: Medicaid Other | Attending: Emergency Medicine | Admitting: Emergency Medicine

## 2022-12-13 ENCOUNTER — Encounter (HOSPITAL_COMMUNITY): Payer: Self-pay

## 2022-12-13 DIAGNOSIS — R1032 Left lower quadrant pain: Secondary | ICD-10-CM | POA: Insufficient documentation

## 2022-12-13 DIAGNOSIS — Z5321 Procedure and treatment not carried out due to patient leaving prior to being seen by health care provider: Secondary | ICD-10-CM | POA: Insufficient documentation

## 2022-12-13 LAB — CBC
HCT: 35.4 % — ABNORMAL LOW (ref 36.0–46.0)
Hemoglobin: 11.3 g/dL — ABNORMAL LOW (ref 12.0–15.0)
MCH: 24.7 pg — ABNORMAL LOW (ref 26.0–34.0)
MCHC: 31.9 g/dL (ref 30.0–36.0)
MCV: 77.5 fL — ABNORMAL LOW (ref 80.0–100.0)
Platelets: 247 10*3/uL (ref 150–400)
RBC: 4.57 MIL/uL (ref 3.87–5.11)
RDW: 14.6 % (ref 11.5–15.5)
WBC: 5.9 10*3/uL (ref 4.0–10.5)
nRBC: 0 % (ref 0.0–0.2)

## 2022-12-13 LAB — URINALYSIS, ROUTINE W REFLEX MICROSCOPIC
Glucose, UA: NEGATIVE mg/dL
Hgb urine dipstick: NEGATIVE
Ketones, ur: NEGATIVE mg/dL
Leukocytes,Ua: NEGATIVE
Nitrite: NEGATIVE
Protein, ur: NEGATIVE mg/dL
Specific Gravity, Urine: 1.026 (ref 1.005–1.030)
pH: 7 (ref 5.0–8.0)

## 2022-12-13 LAB — COMPREHENSIVE METABOLIC PANEL
ALT: 11 U/L (ref 0–44)
AST: 18 U/L (ref 15–41)
Albumin: 3.8 g/dL (ref 3.5–5.0)
Alkaline Phosphatase: 52 U/L (ref 38–126)
Anion gap: 9 (ref 5–15)
BUN: 9 mg/dL (ref 6–20)
CO2: 24 mmol/L (ref 22–32)
Calcium: 8.6 mg/dL — ABNORMAL LOW (ref 8.9–10.3)
Chloride: 100 mmol/L (ref 98–111)
Creatinine, Ser: 0.8 mg/dL (ref 0.44–1.00)
GFR, Estimated: >60 mL/min (ref >60)
Glucose, Bld: 103 mg/dL — ABNORMAL HIGH (ref 70–99)
Potassium: 3.4 mmol/L — ABNORMAL LOW (ref 3.5–5.1)
Sodium: 133 mmol/L — ABNORMAL LOW (ref 135–145)
Total Bilirubin: 0.8 mg/dL (ref 0.3–1.2)
Total Protein: 7.1 g/dL (ref 6.5–8.1)

## 2022-12-13 LAB — HCG, SERUM, QUALITATIVE: Preg, Serum: NEGATIVE

## 2022-12-13 LAB — LIPASE, BLOOD: Lipase: 26 U/L (ref 11–51)

## 2022-12-13 NOTE — ED Triage Notes (Signed)
Lower abd pain more localized on the left side, with a sore throat that hurts when she swallows, also mentions she feels as if she has been breaking out in sweats at home. The abd pain started worsening today but has been ongoing for a month, and the started a month ago also, but it has been an intermittent issue.

## 2022-12-13 NOTE — ED Notes (Signed)
Pt left without being seen.

## 2022-12-14 ENCOUNTER — Emergency Department (HOSPITAL_BASED_OUTPATIENT_CLINIC_OR_DEPARTMENT_OTHER)
Admission: EM | Admit: 2022-12-14 | Discharge: 2022-12-14 | Disposition: A | Discharge status: Home / Self Care | Payer: Medicaid Other | Attending: Emergency Medicine | Admitting: Emergency Medicine

## 2022-12-14 ENCOUNTER — Encounter (HOSPITAL_BASED_OUTPATIENT_CLINIC_OR_DEPARTMENT_OTHER): Payer: Self-pay

## 2022-12-14 ENCOUNTER — Emergency Department (HOSPITAL_BASED_OUTPATIENT_CLINIC_OR_DEPARTMENT_OTHER): Payer: Medicaid Other

## 2022-12-14 DIAGNOSIS — B3731 Acute candidiasis of vulva and vagina: Secondary | ICD-10-CM | POA: Insufficient documentation

## 2022-12-14 DIAGNOSIS — R1032 Left lower quadrant pain: Secondary | ICD-10-CM

## 2022-12-14 DIAGNOSIS — Z20822 Contact with and (suspected) exposure to covid-19: Secondary | ICD-10-CM | POA: Diagnosis not present

## 2022-12-14 DIAGNOSIS — J029 Acute pharyngitis, unspecified: Secondary | ICD-10-CM | POA: Diagnosis not present

## 2022-12-14 DIAGNOSIS — R109 Unspecified abdominal pain: Secondary | ICD-10-CM | POA: Diagnosis present

## 2022-12-14 LAB — GROUP A STREP BY PCR: Group A Strep by PCR: NOT DETECTED

## 2022-12-14 LAB — WET PREP, GENITAL
Clue Cells Wet Prep HPF POC: NONE SEEN
Sperm: NONE SEEN
Trich, Wet Prep: NONE SEEN
WBC, Wet Prep HPF POC: <10 (ref <10)

## 2022-12-14 LAB — RESP PANEL BY RT-PCR (RSV, FLU A&B, COVID)  RVPGX2
Influenza A by PCR: NEGATIVE
Influenza B by PCR: NEGATIVE
Resp Syncytial Virus by PCR: NEGATIVE
SARS Coronavirus 2 by RT PCR: NEGATIVE

## 2022-12-14 MED ORDER — FLUCONAZOLE 200 MG PO TABS: 200.0000 mg | ORAL_TABLET | Freq: Every day | ORAL | 0 refills | Status: AC

## 2022-12-14 NOTE — Discharge Instructions (Addendum)
Recheck with primary care provider if pain persists.  Follow-up with gynecology as needed, will provide resource list below.  Take Diflucan as prescribed. Follow-up in your MyChart account for your remaining test results in 3 to 5 days. IF your strep test is positive, I will sent a prescription for Amoxilcillin to your pharmacy. IF this test is negative, no further treatment is needed at this time.   There are many options for quick evaluation and management of certain GYN issues that do not require a long wait time or big bill from the emergency department.   Consider these options for your care in the future:   Walk-ins for certain complaints available at:   [facility] [address]  [phone]  See hours at [email or web address]   You can make an appointment to see a GYN provider:   [facility] [employer] [employer]  [address]  [phone]   [facility] for [facility] at [facility]  [address] [employer]  [phone]   [facility] for [facility] at [facility] [address] [place]  [phone]   [facility] for [facility] at [employer] [facility]  [address]  [phone]   [facility]  [address] [place]  [phone]   If you already have an established OB/GYN provider in the area you can make an appointment with them as well.

## 2022-12-14 NOTE — ED Provider Notes (Signed)
[place] [facility]ERG[facility] AT [facility] Provider Note   CSN: [id number] Arrival date & time: 12/14/22  1148     History  Chief Complaint  Patient presents with   Abdominal Pain    Also reports throat pain     Angela Acosta is a 27 y.o. female.  27 year old female with complaint of sore throat, lower abdominal pain, vomiting, pain with intercourse, more so on the left. No new partners, does have vaginal dc (white, normally clear). With sweats last night. Patient is concerned about possible gonorrhea infection after looking up her symptoms. No history of prior STI.  Notes white patches on tonsils 1 month ago, took 1 left over antibiotic pill.         Home Medications Prior to Admission medications   Medication Sig Start Date End Date Taking? Authorizing Provider  fluconazole (DIFLUCAN) 200 MG tablet Take 1 tablet (200 mg total) by mouth daily. 12/14/22  Yes Jeannie Fend, PA-C  albuterol (PROVENTIL HFA;VENTOLIN HFA) 108 (90 Base) MCG/ACT inhaler Inhale 2 puffs into the lungs every 4 (four) hours as needed for wheezing or shortness of breath (cough, shortness of breath or wheezing.). 05/15/17   Bing Neighbors, NP  doxycycline (VIBRAMYCIN) 100 MG capsule Take 1 capsule (100 mg total) by mouth 2 (two) times daily. 02/26/22   Smoot, Shawn Route, PA-C  hydrocortisone 1 % ointment Apply 1 application topically 2 (two) times daily. Apply around rectum as needed 11/18/18   Virgina Norfolk, DO  ibuprofen (ADVIL,MOTRIN) 600 MG tablet Take 1 tablet (600 mg total) by mouth every 6 (six) hours. 03/04/18   Levi Aland, MD  IRON PO Take 1 tablet by mouth 2 (two) times daily.    [provider]  naproxen (NAPROSYN) 375 MG tablet Take 1 tablet (375 mg total) by mouth 2 (two) times daily. 02/26/22   Smoot, Shawn Route, PA-C  Prenatal Vit-Fe Fumarate-FA (PRENATAL MULTIVITAMIN) TABS tablet Take 1 tablet by mouth daily at 12 noon.    [provider]      Allergies     Patient has no known allergies.    Review of Systems   Review of Systems Negative except as per HPI Physical Exam Updated Vital Signs BP 130/89 (BP Location: Left Arm)   Pulse 79   Temp 98.1 F (36.7 C)   Resp 18   Ht 5\' 6"  (1.676 m)   Wt 61.2 kg   LMP 11/29/2022 (Approximate)   SpO2 100%   BMI 21.79 kg/m  Physical Exam Vitals and nursing note reviewed.  Constitutional:      General: She is not in acute distress.    Appearance: She is well-developed. She is not diaphoretic.  HENT:     Head: Normocephalic and atraumatic.  Pulmonary:     Effort: Pulmonary effort is normal.  Abdominal:     Tenderness: There is abdominal tenderness in the left lower quadrant.  Skin:    General: Skin is warm and dry.     Findings: No erythema or rash.  Neurological:     Mental Status: She is alert and oriented to person, place, and time.  Psychiatric:        Behavior: Behavior normal.     ED Results / Procedures / Treatments   Labs (all labs ordered are listed, but only abnormal results are displayed) Labs Reviewed  WET PREP, GENITAL - Abnormal; Notable for the following components:      Result Value  Yeast Wet Prep HPF POC PRESENT (*)    All other components within normal limits  RESP PANEL BY RT-PCR (RSV, FLU A&B, COVID)  RVPGX2  GROUP A STREP BY PCR  GC/CHLAMYDIA PROBE AMP ([place]) NOT AT [facility]    EKG None  Radiology US PELVIC COMPLETE W TRANSVAGINAL AND TORSION R/O  Result Date: 12/14/2022 CLINICAL DATA:  Left lower quadrant pain EXAM: TRANSABDOMINAL AND TRANSVAGINAL ULTRASOUND OF PELVIS DOPPLER ULTRASOUND OF OVARIES TECHNIQUE: Both transabdominal and transvaginal ultrasound examinations of the pelvis were performed. Transabdominal technique was performed for global imaging of the pelvis including uterus, ovaries, adnexal regions, and pelvic cul-de-sac. It was necessary to proceed with endovaginal exam following the transabdominal exam to visualize the endometrium and  adnexa. Color and duplex Doppler ultrasound was utilized to evaluate blood flow to the ovaries. COMPARISON:  None Available. FINDINGS: Uterus Measurements: 7.9 x 3.9 x 5.1 cm = volume: 81.7 mL. No fibroids or other mass visualized. Endometrium Thickness: 8 mm.  No focal abnormality visualized. Right ovary Measurements: 2.8 x 2.5 x 2.7 cm = volume: 10 mL. Normal appearance/no adnexal mass. Left ovary Measurements: 3.4 x 2.6 x 2.3 cm = volume: 10.6 mL. Normal appearance/no adnexal mass. Pulsed Doppler evaluation of both ovaries demonstrates normal low-resistance arterial and venous waveforms. Other findings No abnormal free fluid. IMPRESSION: Unremarkable pelvic ultrasound. Electronically Signed   By: Karen Kays M.D.   On: 12/14/2022 14:10    Procedures Procedures    Medications Ordered in ED Medications - No data to display  ED Course/ Medical Decision Making/ A&P                                 Medical Decision Making Amount and/or Complexity of Data Reviewed Labs: ordered. Radiology: ordered.  Risk Prescription drug management.   This patient presents to the ED for concern of sore throat, n/v, LLQ pain, this involves an extensive number of treatment options, and is a complaint that carries with it a high risk of complications and morbidity.  The differential diagnosis includes but not limited to gonorrhea, chlamydia, TOA, ectopic, torsion, cyst, diverticulitis    Co morbidities that complicate the patient evaluation  Sexually active, anemia   Additional history obtained:  External records from outside source obtained and reviewed including lipase, CBC, CMP, UA, u preg (negative) obtained in the ER last night   Lab Tests:  I Ordered, and personally interpreted labs.  The pertinent results include:  wet prep + for yeast, resp panel negative for covid/flu/rsv.  Strep test is negative (result did not crossover into chart, called lab to verify).  GC chlamydia sent out, patient is  aware to follow-up in MyChart.   Imaging Studies ordered:  I ordered imaging studies including pelvic US with doppler   I independently visualized and interpreted imaging which showed doppler flow to both ovaries I agree with the radiologist interpretation, no cyst, torsion, TOA    Problem List / ED Course / Critical interventions / Medication management  27 year old female with complaint as above.  Strep is negative.  Labs reviewed from visit to ER yesterday including negative hCG, normal lipase.  Urinalysis with small amount of bilirubin otherwise unremarkable.  CMP with mild hypokalemia at 3.4.  CBC with hemoglobin 11.3, history of anemia, normal WBC.  Found to have left lower quadrant tenderness on exam, reports pain with intercourse in this area as well.  States she is little  bit of a white discharge which is positive for yeast on wet prep.  Pelvic ultrasound is unremarkable including negative for torsion.  Consider diverticulitis however given patient's young age, normal white count, afebrile, low suspicion.  Patient is follow-up in MyChart for her gonorrhea/chlamydia test results and may require additional treatment for this.  She is provided with information for area gynecologist and request follow-up if her symptoms persist. I have reviewed the patients home medicines and have made adjustments as needed   Social Determinants of Health:  No PCP on file   Test / Admission - Considered:  Stable for discharge. consider prophylaxis for STIs however no new partners, wet prep positive for yeast which likely explains patient white discharge.         Final Clinical Impression(s) / ED Diagnoses Final diagnoses:  Left lower quadrant abdominal pain  Vaginal yeast infection    Rx / DC Orders ED Discharge Orders          Ordered    fluconazole (DIFLUCAN) 200 MG tablet  Daily        12/14/22 1439              Jeannie Fend, PA-C 12/14/22 1625    Rondel Baton, MD 12/16/22 [phone]

## 2022-12-14 NOTE — ED Triage Notes (Signed)
Pt reports to ED with reports of throat pain. States that she has taken some medication and the pain is not any better. Reports that she was just in [place] and that she has had nausea and vomiting x 1.

## 2022-12-22 LAB — GC/CHLAMYDIA PROBE AMP (~~LOC~~) NOT AT ARMC
Chlamydia: NEGATIVE
Comment: NEGATIVE
Comment: NORMAL
Neisseria Gonorrhea: NEGATIVE

## 2023-04-30 ENCOUNTER — Other Ambulatory Visit: Payer: Self-pay

## 2023-04-30 ENCOUNTER — Encounter (HOSPITAL_BASED_OUTPATIENT_CLINIC_OR_DEPARTMENT_OTHER): Payer: Self-pay | Admitting: Emergency Medicine

## 2023-04-30 ENCOUNTER — Emergency Department (HOSPITAL_BASED_OUTPATIENT_CLINIC_OR_DEPARTMENT_OTHER)
Admission: EM | Admit: 2023-04-30 | Discharge: 2023-04-30 | Disposition: A | Discharge status: Home / Self Care | Payer: Medicaid Other | Attending: Student | Admitting: Student

## 2023-04-30 DIAGNOSIS — R109 Unspecified abdominal pain: Secondary | ICD-10-CM | POA: Diagnosis present

## 2023-04-30 DIAGNOSIS — R10819 Abdominal tenderness, unspecified site: Secondary | ICD-10-CM | POA: Diagnosis not present

## 2023-04-30 LAB — URINALYSIS, ROUTINE W REFLEX MICROSCOPIC
Bilirubin Urine: NEGATIVE
Glucose, UA: NEGATIVE mg/dL
Hgb urine dipstick: NEGATIVE
Ketones, ur: NEGATIVE mg/dL
Leukocytes,Ua: NEGATIVE
Nitrite: NEGATIVE
Protein, ur: 30 mg/dL — AB
Specific Gravity, Urine: 1.025 (ref 1.005–1.030)
pH: 7.5 (ref 5.0–8.0)

## 2023-04-30 LAB — CBC
HCT: 32.8 % — ABNORMAL LOW (ref 36.0–46.0)
Hemoglobin: 10.8 g/dL — ABNORMAL LOW (ref 12.0–15.0)
MCH: 25.3 pg — ABNORMAL LOW (ref 26.0–34.0)
MCHC: 32.9 g/dL (ref 30.0–36.0)
MCV: 76.8 fL — ABNORMAL LOW (ref 80.0–100.0)
Platelets: 239 10*3/uL (ref 150–400)
RBC: 4.27 MIL/uL (ref 3.87–5.11)
RDW: 14.5 % (ref 11.5–15.5)
WBC: 4.4 10*3/uL (ref 4.0–10.5)
nRBC: 0 % (ref 0.0–0.2)

## 2023-04-30 LAB — COMPREHENSIVE METABOLIC PANEL
ALT: 11 U/L (ref 0–44)
AST: 18 U/L (ref 15–41)
Albumin: 3.9 g/dL (ref 3.5–5.0)
Alkaline Phosphatase: 40 U/L (ref 38–126)
Anion gap: 6 (ref 5–15)
BUN: 9 mg/dL (ref 6–20)
CO2: 24 mmol/L (ref 22–32)
Calcium: 8.4 mg/dL — ABNORMAL LOW (ref 8.9–10.3)
Chloride: 104 mmol/L (ref 98–111)
Creatinine, Ser: 0.83 mg/dL (ref 0.44–1.00)
GFR, Estimated: >60 mL/min (ref >60)
Glucose, Bld: 117 mg/dL — ABNORMAL HIGH (ref 70–99)
Potassium: 3.3 mmol/L — ABNORMAL LOW (ref 3.5–5.1)
Sodium: 134 mmol/L — ABNORMAL LOW (ref 135–145)
Total Bilirubin: 0.5 mg/dL (ref 0.0–1.2)
Total Protein: 7 g/dL (ref 6.5–8.1)

## 2023-04-30 LAB — URINALYSIS, MICROSCOPIC (REFLEX)

## 2023-04-30 LAB — PREGNANCY, URINE: Preg Test, Ur: NEGATIVE

## 2023-04-30 LAB — LIPASE, BLOOD: Lipase: 29 U/L (ref 11–51)

## 2023-04-30 NOTE — ED Provider Notes (Signed)
 [place] EMERGENCY [facility]MENT AT [facility] Provider Note   CSN: [id number] Arrival date & time: 04/30/23  1536     History  Chief Complaint  Patient presents with   Abdominal Pain    Angela Acosta is a 28 y.o. female with a PMHx of ovarian cysts presents with 3 day history of left sided flank pain. She reports every since she had her 41-year-old she has had dull pain on her left side that comes and goes. However, the past 3 days it has been worse. She describes the pain as achy that at times radiates up her back. She says it feels like I was hit with something but wasn't. She says her side is mainly tender to touch. The pain prevents her from lying on her left side. The pain mainly hurts in the morning and gets better throughout the day. She has tried Aleve  and pain cream that has not seemed to help. Her pain is mildly relieved by gas. Additionally, last night she noticed she had chills and night sweats but never formally took a temp. She says this feels different then her ovarian cysts. She does not believe she is pregnant and has not had any new sexual partners. She does not take any medications and has not tried any new foods. She denies any sick contacts.   Pain overall has been intermittent for several months.  She states that she got a new mattress about 5 months ago and is not sure if that could be related.  She also tends to have worsening pain around her menstrual cycle, and is about to start her next period.  She has not seen a GYN in some time.  She does report she took Plan B a few days ago.   States she was seen previously for same symptoms back in August.   Abdominal Pain Associated symptoms: diarrhea and nausea        Home Medications Prior to Admission medications   Medication Sig Start Date End Date Taking? Authorizing Provider  albuterol  (PROVENTIL  HFA;VENTOLIN  HFA) 108 (90 Base) MCG/ACT inhaler Inhale 2 puffs into the lungs every 4 (four) hours as  needed for wheezing or shortness of breath (cough, shortness of breath or wheezing.). 05/15/17   Arloa Suzen RAMAN, NP  doxycycline  (VIBRAMYCIN ) 100 MG capsule Take 1 capsule (100 mg total) by mouth 2 (two) times daily. 02/26/22   Smoot, Lauraine LABOR, PA-C  fluconazole  (DIFLUCAN ) 200 MG tablet Take 1 tablet (200 mg total) by mouth daily. 12/14/22   Beverley Leita LABOR, PA-C  hydrocortisone  1 % ointment Apply 1 application topically 2 (two) times daily. Apply around rectum as needed 11/18/18   Curatolo, Adam, DO  ibuprofen  (ADVIL ,MOTRIN ) 600 MG tablet Take 1 tablet (600 mg total) by mouth every 6 (six) hours. 03/04/18   Lenon Oneil BRAVO, MD  IRON PO Take 1 tablet by mouth 2 (two) times daily.    [provider]  naproxen  (NAPROSYN ) 375 MG tablet Take 1 tablet (375 mg total) by mouth 2 (two) times daily. 02/26/22   Smoot, Lauraine LABOR, PA-C  Prenatal Vit-Fe Fumarate-FA (PRENATAL MULTIVITAMIN) TABS tablet Take 1 tablet by mouth daily at 12 noon.    [provider]      Allergies    Patient has no known allergies.    Review of Systems   Review of Systems  Gastrointestinal:  Positive for abdominal pain, diarrhea and nausea.  All other systems reviewed and are negative.   Physical  Exam Updated Vital Signs BP 120/82   Pulse 69   Temp (!) 97.4 F (36.3 C) (Oral)   Resp 16   Ht 5' 6 (1.676 m)   Wt 62.1 kg   LMP 03/30/2023   SpO2 100%   Breastfeeding No   BMI 22.11 kg/m  Physical Exam Vitals and nursing note reviewed.  Constitutional:      Appearance: Normal appearance.  HENT:     Head: Normocephalic and atraumatic.  Eyes:     Conjunctiva/sclera: Conjunctivae normal.  Cardiovascular:     Rate and Rhythm: Normal rate and regular rhythm.  Pulmonary:     Effort: Pulmonary effort is normal. No respiratory distress.     Breath sounds: Normal breath sounds.  Abdominal:     General: There is no distension.     Palpations: Abdomen is soft.     Tenderness: There is abdominal  tenderness. There is no right CVA tenderness, left CVA tenderness, guarding or rebound.       Comments: Localizes pain to left trunk, some tenderness to palpation, no rebound or guarding  Skin:    General: Skin is warm and dry.  Neurological:     General: No focal deficit present.     Mental Status: She is alert.     ED Results / Procedures / Treatments   Labs (all labs ordered are listed, but only abnormal results are displayed) Labs Reviewed  COMPREHENSIVE METABOLIC PANEL - Abnormal; Notable for the following components:      Result Value   Sodium 134 (*)    Potassium 3.3 (*)    Glucose, Bld 117 (*)    Calcium 8.4 (*)    All other components within normal limits  CBC - Abnormal; Notable for the following components:   Hemoglobin 10.8 (*)    HCT 32.8 (*)    MCV 76.8 (*)    MCH 25.3 (*)    All other components within normal limits  URINALYSIS, ROUTINE W REFLEX MICROSCOPIC - Abnormal; Notable for the following components:   Protein, ur 30 (*)    All other components within normal limits  URINALYSIS, MICROSCOPIC (REFLEX) - Abnormal; Notable for the following components:   Bacteria, UA RARE (*)    All other components within normal limits  LIPASE, BLOOD  PREGNANCY, URINE    EKG None  Radiology No results found.  Procedures Procedures    Medications Ordered in ED Medications - No data to display  ED Course/ Medical Decision Making/ A&P                                 Medical Decision Making Amount and/or Complexity of Data Reviewed Labs: ordered.  This patient is a 28 y.o. female  who presents to the ED for concern of left sided abdominal/back pain intermittently for months, worse in past 2 days.   Differential diagnoses prior to evaluation: The emergent differential diagnosis includes, but is not limited to,  AAA, renal artery/vein embolism/thrombosis, mesenteric ischemia, pyelonephritis, nephrolithiasis, cystitis, biliary colic, pancreatitis, perforated  peptic ulcer, appendicitis, diverticulitis, bowel obstruction, Ectopic Pregnancy, PID/TOA, Ovarian cyst, Ovarian torsion. This is not an exhaustive differential.   Past Medical History / Co-morbidities / Social History: Anemia, panic disorder, heart murmur  Additional history: Chart reviewed. Pertinent results include: Reviewed ER visit note from August 2024 in which patient presented with similar symptoms, she was diagnosed with yeast infection at that time and was  discharged.  Physical Exam: Physical exam performed. The pertinent findings include: Normal vital signs, no acute distress.  Abdomen soft and overall nontender, some pain on the left lateral trunk, no CVA tenderness.  Lab Tests/Imaging studies: I personally interpreted labs/imaging and the pertinent results include: CBC with stable anemia.  CMP grossly unremarkable.  UA negative, urine pregnancy negative.  Normal lipase.  I did consider abdominal imaging for this patient, however she is clinically very well-appearing with a benign exam, and reassuring laboratory evaluation, I do not feel she urgently needs imaging at this time.  Disposition: After consideration of the diagnostic results and the patients response to treatment, I feel that emergency department workup does not suggest an emergent condition requiring admission or immediate intervention beyond what has been performed at this time. The plan is: Discharge to home.  I suspect pain is more musculoskeletal in origin.  Patient is having no urinary symptoms and a negative UA. Low clinical concern for ovarian torsion. She has been seen for same symptoms previously and had negative torsion ultrasound which is reassuring at this time. Patient plans to follow up with her PCP this week, given referral for OBGYN. The patient is safe for discharge and has been instructed to return immediately for worsening symptoms, change in symptoms or any other concerns.  Final Clinical Impression(s)  / ED Diagnoses Final diagnoses:  Left sided abdominal pain    Rx / DC Orders ED Discharge Orders     None      Portions of this report may have been transcribed using voice recognition software. Every effort was made to ensure accuracy; however, inadvertent computerized transcription errors may be present.    Drayden Lukas T, PA-C 04/30/23 2301    Albertina Dixon, MD 05/01/23 1322

## 2023-04-30 NOTE — Discharge Instructions (Addendum)
 You were seen in the emergency department today for abdominal and back pain.  As we discussed your blood work looked reassuring.  Your urine sample did not show evidence of UTI.  I suspect that your pain is more musculoskeletal in origin especially because it is worse when you wake up first thing in the morning, however I would like you to follow-up with a primary doctor regarding it.    I have also attached some contact information for an OB/GYN to discuss pain surrounding your menstrual cycles.  Continue to monitor how you're doing and return to the ER for new or worsening symptoms.

## 2023-04-30 NOTE — ED Triage Notes (Signed)
 LLQ abdominal pain with some nausea and diarrhea for several months.  Pt states her pain was worse for last 2 days.  Pt does admit to feeling bloated and feels better after passing gas.  No known fever.  No vaginal discharge.  Pt states her bm does change color.

## 2023-09-13 ENCOUNTER — Emergency Department (HOSPITAL_COMMUNITY)
Admission: EM | Admit: 2023-09-13 | Discharge: 2023-09-13 | Disposition: A | Discharge status: Home / Self Care | Attending: Emergency Medicine | Admitting: Emergency Medicine

## 2023-09-13 ENCOUNTER — Encounter (HOSPITAL_COMMUNITY): Payer: Self-pay

## 2023-09-13 ENCOUNTER — Other Ambulatory Visit: Payer: Self-pay

## 2023-09-13 DIAGNOSIS — R109 Unspecified abdominal pain: Secondary | ICD-10-CM | POA: Diagnosis present

## 2023-09-13 DIAGNOSIS — N760 Acute vaginitis: Secondary | ICD-10-CM | POA: Insufficient documentation

## 2023-09-13 LAB — WET PREP, GENITAL
Clue Cells Wet Prep HPF POC: NONE SEEN
Sperm: NONE SEEN
Trich, Wet Prep: NONE SEEN
WBC, Wet Prep HPF POC: <10 (ref <10)
Yeast Wet Prep HPF POC: NONE SEEN

## 2023-09-13 LAB — HCG, SERUM, QUALITATIVE: Preg, Serum: NEGATIVE

## 2023-09-13 MED ORDER — METRONIDAZOLE 500 MG PO TABS: 500.0000 mg | ORAL_TABLET | Freq: Two times a day (BID) | ORAL | 0 refills | Status: AC

## 2023-09-13 NOTE — Discharge Instructions (Addendum)
Return if any problems.  See your Physician for recheck  °

## 2023-09-13 NOTE — ED Notes (Signed)
 Awaiting patient from lobby.

## 2023-09-13 NOTE — ED Triage Notes (Signed)
 Pt came in via POV d/t the last couple of days having vaginal discharge with an odor, associated with discomfort in that area & pain during intercourse. Also reports a couple of days ago she had a fever with sweats. A/Ox4, rates pain 6/10 during triage.

## 2023-09-13 NOTE — ED Provider Notes (Signed)
 [place] EMERGENCY DEPARTMENT AT [facility] Provider Note   CSN: [id number] Arrival date & time: 09/13/23  1191     History  Chief Complaint  Patient presents with   Fever   Vaginal Discharge    Angela Acosta is a 28 y.o. female.  Patient is here today for vaginal discharge and abdominal pain that has been going on for 3 weeks. she notes white, clumpy vaginal discharge with a fishy odor. She also has left lower quadrant abdominal pain and rates it a 3/10. She has been taking tylenol . she has a history of ovarian cysts and recently got an ultrasound which showed no cysts present. 3 weeks ago she noticed these symptoms and was able to obtain antibiotics online for gonorrhea and chlamydia, and she states she completed these medications but did not take as prescribed. She has also been using Boric acid suppositories which has not provided any relief. She is sexually active and does not use condoms. She notes pain during sexual intercourse. States her LMP was 2 1/2 weeks ago and she might have noticed some spotting starting today. Patient states she felt feverish but has no fever in ED today. Denies vomiting, diarrhea, constipation, changes in urinary frequency, and dysuria.   Fever Vaginal Discharge Associated symptoms: fever        Home Medications Prior to Admission medications   Medication Sig Start Date End Date Taking? Authorizing Provider  albuterol  (PROVENTIL  HFA;VENTOLIN  HFA) 108 (90 Base) MCG/ACT inhaler Inhale 2 puffs into the lungs every 4 (four) hours as needed for wheezing or shortness of breath (cough, shortness of breath or wheezing.). 05/15/17   Buena Carmine, NP  doxycycline  (VIBRAMYCIN ) 100 MG capsule Take 1 capsule (100 mg total) by mouth 2 (two) times daily. 02/26/22   Smoot, Genevive Ket, PA-C  fluconazole  (DIFLUCAN ) 200 MG tablet Take 1 tablet (200 mg total) by mouth daily. 12/14/22   Darlis Eisenmenger, PA-C  hydrocortisone  1 % ointment Apply 1  application topically 2 (two) times daily. Apply around rectum as needed 11/18/18   Lowery Rue, DO  ibuprofen  (ADVIL ,MOTRIN ) 600 MG tablet Take 1 tablet (600 mg total) by mouth every 6 (six) hours. 03/04/18   Hamp Levine, MD  IRON PO Take 1 tablet by mouth 2 (two) times daily.    [provider]  naproxen  (NAPROSYN ) 375 MG tablet Take 1 tablet (375 mg total) by mouth 2 (two) times daily. 02/26/22   Smoot, Genevive Ket, PA-C  Prenatal Vit-Fe Fumarate-FA (PRENATAL MULTIVITAMIN) TABS tablet Take 1 tablet by mouth daily at 12 noon.    [provider]      Allergies    Patient has no known allergies.    Review of Systems   Review of Systems  Constitutional:  Positive for fever.  Genitourinary:  Positive for vaginal discharge.  All other systems reviewed and are negative.   Physical Exam Updated Vital Signs BP 127/81 (BP Location: Left Arm)   Pulse 85   Temp 98.3 F (36.8 C) (Oral)   Resp 17   Ht 5' 7 (1.702 m)   Wt 64.9 kg   SpO2 100%   BMI 22.40 kg/m  Physical Exam Vitals and nursing note reviewed.  Constitutional:      Appearance: She is well-developed.  HENT:     Head: Normocephalic.     Nose: Nose normal.  Cardiovascular:     Rate and Rhythm: Normal rate.  Pulmonary:     Effort: Pulmonary effort  is normal.  Abdominal:     General: There is no distension.  Musculoskeletal:        General: Normal range of motion.     Cervical back: Normal range of motion.  Skin:    General: Skin is warm.  Neurological:     General: No focal deficit present.     Mental Status: She is alert and oriented to person, place, and time.  Psychiatric:        Mood and Affect: Mood normal.     ED Results / Procedures / Treatments   Labs (all labs ordered are listed, but only abnormal results are displayed) Labs Reviewed  WET PREP, GENITAL  HCG, SERUM, QUALITATIVE  GC/CHLAMYDIA PROBE AMP ([place]) NOT AT [facility]    EKG None  Radiology No results  found.  Procedures Procedures    Medications Ordered in ED Medications - No data to display  ED Course/ Medical Decision Making/ A&P Clinical Course as of 09/13/23 1131  Wed Sep 13, 2023  1101 GC/Chlamydia probe amp ([place])not at [facility] [[place]]  1102 hCG, serum, qualitative [MT]    Clinical Course User Index [MT] Tanner, Madelyn, Student-PA                                 Medical Decision Making Amount and/or Complexity of Data Reviewed Labs: ordered.           Final Clinical Impression(s) / ED Diagnoses Final diagnoses:  Acute vaginitis    Rx / DC Orders ED Discharge Orders          Ordered    metroNIDAZOLE  (FLAGYL ) 500 MG tablet  2 times daily        09/13/23 1204          An After Visit Summary was printed and given to the patient.     Danamarie Minami K, PA-C 09/13/23 1207    Long, Joshua G, MD 09/28/23 Jaquelyn Merino

## 2023-09-13 NOTE — ED Notes (Signed)
Pelvic cart outside of room ?

## 2023-09-14 LAB — GC/CHLAMYDIA PROBE AMP (~~LOC~~) NOT AT ARMC
Chlamydia: NEGATIVE
Comment: NEGATIVE
Comment: NORMAL
Neisseria Gonorrhea: NEGATIVE
